# Patient Record
Sex: Female | Born: 1975 | Race: Black or African American | Hispanic: No | Marital: Married | Smoking: Never smoker
Health system: Southern US, Community
[De-identification: ages and names within clinical notes are randomized; demographics above are authoritative.]

## PROBLEM LIST (undated history)

## (undated) ENCOUNTER — Inpatient Hospital Stay (HOSPITAL_COMMUNITY): Payer: Self-pay

## (undated) DIAGNOSIS — Z789 Other specified health status: Secondary | ICD-10-CM

## (undated) DIAGNOSIS — K219 Gastro-esophageal reflux disease without esophagitis: Secondary | ICD-10-CM

## (undated) DIAGNOSIS — F32A Depression, unspecified: Secondary | ICD-10-CM

## (undated) DIAGNOSIS — J9859 Other diseases of mediastinum, not elsewhere classified: Secondary | ICD-10-CM

## (undated) DIAGNOSIS — O139 Gestational [pregnancy-induced] hypertension without significant proteinuria, unspecified trimester: Secondary | ICD-10-CM

## (undated) DIAGNOSIS — J3089 Other allergic rhinitis: Secondary | ICD-10-CM

## (undated) HISTORY — DX: Depression, unspecified: F32.A

## (undated) HISTORY — DX: Gestational (pregnancy-induced) hypertension without significant proteinuria, unspecified trimester: O13.9

## (undated) HISTORY — DX: Other diseases of mediastinum, not elsewhere classified: J98.59

## (undated) HISTORY — DX: Gastro-esophageal reflux disease without esophagitis: K21.9

## (undated) HISTORY — DX: Other allergic rhinitis: J30.89

---

## 2006-09-01 ENCOUNTER — Ambulatory Visit (HOSPITAL_COMMUNITY): Admission: RE | Admit: 2006-09-01 | Discharge: 2006-09-01 | Payer: Self-pay | Admitting: Obstetrics and Gynecology

## 2007-02-01 ENCOUNTER — Ambulatory Visit (HOSPITAL_COMMUNITY): Admission: RE | Admit: 2007-02-01 | Discharge: 2007-02-01 | Payer: Self-pay | Admitting: Obstetrics & Gynecology

## 2007-02-01 ENCOUNTER — Ambulatory Visit: Payer: Self-pay | Admitting: Obstetrics and Gynecology

## 2007-02-08 ENCOUNTER — Inpatient Hospital Stay (HOSPITAL_COMMUNITY): Admission: AD | Admit: 2007-02-08 | Discharge: 2007-02-11 | Payer: Self-pay | Admitting: Obstetrics & Gynecology

## 2007-02-09 ENCOUNTER — Ambulatory Visit: Payer: Self-pay | Admitting: Obstetrics & Gynecology

## 2009-09-17 ENCOUNTER — Emergency Department (HOSPITAL_COMMUNITY): Admission: EM | Admit: 2009-09-17 | Discharge: 2009-09-17 | Payer: Self-pay | Admitting: Emergency Medicine

## 2010-01-10 ENCOUNTER — Ambulatory Visit: Payer: Self-pay | Admitting: Family Medicine

## 2010-01-23 ENCOUNTER — Encounter (INDEPENDENT_AMBULATORY_CARE_PROVIDER_SITE_OTHER): Payer: Self-pay | Admitting: Family Medicine

## 2010-01-23 ENCOUNTER — Ambulatory Visit: Payer: Self-pay | Admitting: Internal Medicine

## 2010-01-23 LAB — CONVERTED CEMR LAB
ALT: 11 units/L (ref 0–35)
AST: 12 units/L (ref 0–37)
Albumin: 4.2 g/dL (ref 3.5–5.2)
Alkaline Phosphatase: 49 units/L (ref 39–117)
BUN: 9 mg/dL (ref 6–23)
Basophils Absolute: 0 10*3/uL (ref 0.0–0.1)
Basophils Relative: 0 % (ref 0–1)
CO2: 25 meq/L (ref 19–32)
Calcium: 9.4 mg/dL (ref 8.4–10.5)
Chloride: 106 meq/L (ref 96–112)
Cholesterol: 133 mg/dL (ref 0–200)
Creatinine, Ser: 0.65 mg/dL (ref 0.40–1.20)
Eosinophils Absolute: 0.1 10*3/uL (ref 0.0–0.7)
Eosinophils Relative: 1 % (ref 0–5)
Glucose, Bld: 92 mg/dL (ref 70–99)
HCT: 38.6 % (ref 36.0–46.0)
HDL: 44 mg/dL (ref >39)
Hemoglobin: 12.4 g/dL (ref 12.0–15.0)
LDL Cholesterol: 83 mg/dL (ref 0–99)
Lymphocytes Relative: 47 % — ABNORMAL HIGH (ref 12–46)
Lymphs Abs: 2.1 10*3/uL (ref 0.7–4.0)
MCHC: 32.1 g/dL (ref 30.0–36.0)
MCV: 91.7 fL (ref 78.0–100.0)
Monocytes Absolute: 0.3 10*3/uL (ref 0.1–1.0)
Monocytes Relative: 7 % (ref 3–12)
Neutro Abs: 2 10*3/uL (ref 1.7–7.7)
Neutrophils Relative %: 45 % (ref 43–77)
Platelets: 229 10*3/uL (ref 150–400)
Potassium: 4.1 meq/L (ref 3.5–5.3)
RBC: 4.21 M/uL (ref 3.87–5.11)
RDW: 13.1 % (ref 11.5–15.5)
Sodium: 139 meq/L (ref 135–145)
Total Bilirubin: 1.3 mg/dL — ABNORMAL HIGH (ref 0.3–1.2)
Total CHOL/HDL Ratio: 3
Total Protein: 7.1 g/dL (ref 6.0–8.3)
Triglycerides: 32 mg/dL (ref <150)
VLDL: 6 mg/dL (ref 0–40)
Vit D, 25-Hydroxy: 27 ng/mL — ABNORMAL LOW (ref 30–89)
WBC: 4.5 10*3/uL (ref 4.0–10.5)

## 2010-06-17 ENCOUNTER — Ambulatory Visit (HOSPITAL_COMMUNITY)
Admission: RE | Admit: 2010-06-17 | Discharge: 2010-06-17 | Payer: Self-pay | Source: Home / Self Care | Admitting: Obstetrics & Gynecology

## 2010-11-05 ENCOUNTER — Ambulatory Visit: Admit: 2010-11-05 | Payer: Self-pay | Admitting: Obstetrics & Gynecology

## 2010-11-08 ENCOUNTER — Inpatient Hospital Stay (HOSPITAL_COMMUNITY)
Admission: AD | Admit: 2010-11-08 | Discharge: 2010-11-10 | Payer: Self-pay | Source: Home / Self Care | Attending: Obstetrics & Gynecology | Admitting: Obstetrics & Gynecology

## 2010-11-11 LAB — CBC
HCT: 37.4 % (ref 36.0–46.0)
HCT: 30.2 % — ABNORMAL LOW (ref 36.0–46.0)
Hemoglobin: 12.5 g/dL (ref 12.0–15.0)
Hemoglobin: 10.3 g/dL — ABNORMAL LOW (ref 12.0–15.0)
MCH: 29.6 pg (ref 26.0–34.0)
MCH: 30.1 pg (ref 26.0–34.0)
MCHC: 33.4 g/dL (ref 30.0–36.0)
MCHC: 34.1 g/dL (ref 30.0–36.0)
MCV: 88.6 fL (ref 78.0–100.0)
MCV: 88.3 fL (ref 78.0–100.0)
Platelets: 230 10*3/uL (ref 150–400)
Platelets: 175 10*3/uL (ref 150–400)
RBC: 4.22 MIL/uL (ref 3.87–5.11)
RBC: 3.42 MIL/uL — ABNORMAL LOW (ref 3.87–5.11)
RDW: 13 % (ref 11.5–15.5)
RDW: 13 % (ref 11.5–15.5)
WBC: 12.6 10*3/uL — ABNORMAL HIGH (ref 4.0–10.5)
WBC: 10.1 10*3/uL (ref 4.0–10.5)

## 2010-11-11 LAB — RPR: RPR Ser Ql: NONREACTIVE

## 2010-11-13 LAB — RH IMMUNE GLOB WKUP(>/=20WKS)(NOT WOMEN'S HOSP)
Fetal Screen: NEGATIVE
Unit division: 0

## 2011-01-29 LAB — POCT I-STAT, CHEM 8
BUN: 12 mg/dL (ref 6–23)
Calcium, Ion: 1.27 mmol/L (ref 1.12–1.32)
Chloride: 104 mEq/L (ref 96–112)
Creatinine, Ser: 0.7 mg/dL (ref 0.4–1.2)
Glucose, Bld: 87 mg/dL (ref 70–99)
HCT: 38 % (ref 36.0–46.0)
Hemoglobin: 12.9 g/dL (ref 12.0–15.0)
Potassium: 3.7 mEq/L (ref 3.5–5.1)
Sodium: 141 mEq/L (ref 135–145)
TCO2: 24 mmol/L (ref 0–100)

## 2015-07-12 ENCOUNTER — Inpatient Hospital Stay (HOSPITAL_COMMUNITY): Payer: Medicaid Other

## 2015-07-12 ENCOUNTER — Encounter (HOSPITAL_COMMUNITY): Payer: Self-pay | Admitting: *Deleted

## 2015-07-12 ENCOUNTER — Inpatient Hospital Stay (HOSPITAL_COMMUNITY)
Admission: AD | Admit: 2015-07-12 | Discharge: 2015-07-12 | Disposition: A | Discharge status: Home / Self Care | Payer: Medicaid Other | Source: Ambulatory Visit | Attending: Family Medicine | Admitting: Family Medicine

## 2015-07-12 DIAGNOSIS — Z3A01 Less than 8 weeks gestation of pregnancy: Secondary | ICD-10-CM | POA: Diagnosis not present

## 2015-07-12 DIAGNOSIS — O26899 Other specified pregnancy related conditions, unspecified trimester: Secondary | ICD-10-CM

## 2015-07-12 DIAGNOSIS — O200 Threatened abortion: Secondary | ICD-10-CM | POA: Diagnosis not present

## 2015-07-12 DIAGNOSIS — R109 Unspecified abdominal pain: Secondary | ICD-10-CM | POA: Diagnosis not present

## 2015-07-12 DIAGNOSIS — O9989 Other specified diseases and conditions complicating pregnancy, childbirth and the puerperium: Secondary | ICD-10-CM

## 2015-07-12 DIAGNOSIS — O3680X0 Pregnancy with inconclusive fetal viability, not applicable or unspecified: Secondary | ICD-10-CM

## 2015-07-12 DIAGNOSIS — O209 Hemorrhage in early pregnancy, unspecified: Secondary | ICD-10-CM

## 2015-07-12 HISTORY — DX: Other specified health status: Z78.9

## 2015-07-12 LAB — CBC
HCT: 38.8 % (ref 36.0–46.0)
Hemoglobin: 12.9 g/dL (ref 12.0–15.0)
MCH: 29.8 pg (ref 26.0–34.0)
MCHC: 33.2 g/dL (ref 30.0–36.0)
MCV: 89.6 fL (ref 78.0–100.0)
Platelets: 251 10*3/uL (ref 150–400)
RBC: 4.33 MIL/uL (ref 3.87–5.11)
RDW: 13.4 % (ref 11.5–15.5)
WBC: 6.9 10*3/uL (ref 4.0–10.5)

## 2015-07-12 LAB — POCT PREGNANCY, URINE: Preg Test, Ur: POSITIVE — AB

## 2015-07-12 LAB — URINALYSIS, ROUTINE W REFLEX MICROSCOPIC
Bilirubin Urine: NEGATIVE
Glucose, UA: NEGATIVE mg/dL
Ketones, ur: NEGATIVE mg/dL
Nitrite: NEGATIVE
Protein, ur: NEGATIVE mg/dL
Specific Gravity, Urine: 1.01 (ref 1.005–1.030)
Urobilinogen, UA: 0.2 mg/dL (ref 0.0–1.0)
pH: 6 (ref 5.0–8.0)

## 2015-07-12 LAB — ABO/RH: ABO/RH(D): O NEG

## 2015-07-12 LAB — URINE MICROSCOPIC-ADD ON

## 2015-07-12 LAB — HCG, QUANTITATIVE, PREGNANCY: hCG, Beta Chain, Quant, S: 398 m[IU]/mL — ABNORMAL HIGH (ref <5)

## 2015-07-12 MED ORDER — RHO D IMMUNE GLOBULIN 1500 UNIT/2ML IJ SOSY
300.0000 ug | PREFILLED_SYRINGE | Freq: Once | INTRAMUSCULAR | Status: AC
  Administered 2015-07-12: 300 ug via INTRAMUSCULAR
  Filled 2015-07-12: qty 2

## 2015-07-12 MED ORDER — IBUPROFEN 600 MG PO TABS: 600.0000 mg | ORAL_TABLET | Freq: Four times a day (QID) | ORAL | Status: DC | PRN

## 2015-07-12 NOTE — MAU Note (Signed)
Rhophylac information sheet given to pt.

## 2015-07-12 NOTE — MAU Provider Note (Signed)
History     CSN: [id number]  Arrival date and time: 07/12/15 2836   First Provider Initiated Contact with Patient 07/12/15 1043      Chief Complaint  Patient presents with  . Vaginal Bleeding   HPI   Casey Mayo is a 39 y.o. female [id number] at [redacted]w[redacted]d presenting to MAU with vaginal bleeding. The bleeding started yesterday; no recent intercourse.  The bleeding is described as light currently, however yesterday it was like a menstrual cycle.   The bleeding is heavy enough that she has a pad on currently. She is having lower abdominal cramping she rates her pain 3/10. She has not taken any pain medication today.   She was seen at the [facility] department on 8/24; reviewed records that the patient brought with her. Patient said she had a speculum and cultures were obtained.     OB History    Gravida Para Term Preterm AB TAB SAB Ectopic Multiple Living   5 3   1     3       Past Medical History  Diagnosis Date  . Medical history non-contributory     History reviewed. No pertinent past surgical history.  History reviewed. No pertinent family history.  Social History  Substance Use Topics  . Smoking status: Never Smoker   . Smokeless tobacco: None  . Alcohol Use: No    Allergies: No Known Allergies  Prescriptions prior to admission  Medication Sig Dispense Refill Last Dose  . acetaminophen (TYLENOL) 325 MG tablet Take 650 mg by mouth every 6 (six) hours as needed for mild pain.   07/11/2015 at Unknown time  . Multiple Vitamin (MULTIVITAMIN WITH MINERALS) TABS tablet Take 1 tablet by mouth daily.   Past Week at Unknown time   Results for orders placed or performed during the hospital encounter of 07/12/15 (from the past 48 hour(s))  Urinalysis, Routine w reflex microscopic (not at [facility])     Status: Abnormal   Collection Time: 07/12/15  9:00 AM  Result Value Ref Range   Color, Urine YELLOW YELLOW   APPearance CLEAR CLEAR   Specific Gravity, Urine 1.010  1.005 - 1.030   pH 6.0 5.0 - 8.0   Glucose, UA NEGATIVE NEGATIVE mg/dL   Hgb urine dipstick LARGE (A) NEGATIVE   Bilirubin Urine NEGATIVE NEGATIVE   Ketones, ur NEGATIVE NEGATIVE mg/dL   Protein, ur NEGATIVE NEGATIVE mg/dL   Urobilinogen, UA 0.2 0.0 - 1.0 mg/dL   Nitrite NEGATIVE NEGATIVE   Leukocytes, UA TRACE (A) NEGATIVE  Urine microscopic-add on     Status: None   Collection Time: 07/12/15  9:00 AM  Result Value Ref Range   Squamous Epithelial / LPF RARE RARE   WBC, UA 0-2 <3 WBC/hpf   RBC / HPF 21-50 <3 RBC/hpf  Pregnancy, urine POC     Status: Abnormal   Collection Time: 07/12/15  9:07 AM  Result Value Ref Range   Preg Test, Ur POSITIVE (A) NEGATIVE    Comment:        THE SENSITIVITY OF THIS METHODOLOGY IS >24 mIU/mL   CBC     Status: None   Collection Time: 07/12/15 11:25 AM  Result Value Ref Range   WBC 6.9 4.0 - 10.5 K/uL   RBC 4.33 3.87 - 5.11 MIL/uL   Hemoglobin 12.9 12.0 - 15.0 g/dL   HCT 38.8 36.0 - 46.0 %   MCV 89.6 78.0 - 100.0 fL   MCH 29.8 26.0 - 34.0 pg  MCHC 33.2 30.0 - 36.0 g/dL   RDW 13.4 11.5 - 15.5 %   Platelets 251 150 - 400 K/uL  ABO/Rh     Status: None   Collection Time: 07/12/15 11:25 AM  Result Value Ref Range   ABO/RH(D) O NEG   hCG, quantitative, pregnancy     Status: Abnormal   Collection Time: 07/12/15 11:25 AM  Result Value Ref Range   hCG, Beta Chain, Quant, S 398 (H) <5 mIU/mL    Comment:          GEST. AGE      CONC.  (mIU/mL)   <=1 WEEK        5 - 50     2 WEEKS       50 - 500     3 WEEKS       100 - 10,000     4 WEEKS     1,000 - 30,000     5 WEEKS     3,500 - 115,000   6-8 WEEKS     12,000 - 270,000    12 WEEKS     15,000 - 220,000        FEMALE AND NON-PREGNANT FEMALE:     LESS THAN 5 mIU/mL Performed at [facility]   Rh IG workup (includes ABO/Rh)     Status: None (Preliminary result)   Collection Time: 07/12/15 11:25 AM  Result Value Ref Range   Gestational Age(Wks) 7    ABO/RH(D) O NEG    Unit Number  [id number]    Blood Component Type RHIG    Unit division 00    Status of Unit ISSUED    Transfusion Status OK TO TRANSFUSE      Review of Systems  Constitutional: Negative for fever.  Gastrointestinal: Positive for abdominal pain. Negative for nausea and vomiting.  Genitourinary: Negative for dysuria.   Physical Exam   Blood pressure 124/62, pulse 66, resp. rate 18, height 5\' 9"  (1.753 m), weight 93.441 kg (206 lb), last menstrual period 05/20/2015, SpO2 100 %.  Physical Exam  Constitutional: She is oriented to person, place, and time. She appears well-developed and well-nourished. No distress.  HENT:  Head: Normocephalic.  Eyes: Pupils are equal, round, and reactive to light.  Neck: Neck supple.  Respiratory: Effort normal.  GI: Soft. She exhibits no distension. There is no tenderness. There is no rebound and no guarding.  Genitourinary:  Speculum exam: Vagina - Moderate amount of thick, bright red blood pooling in the vagina.  Cervix -+ active bleeding  Bimanual exam: Cervix 1cm, thick, anterior  Uterus non tender, normal size Adnexa non tender, no masses bilaterally Chaperone present for exam.  Musculoskeletal: Normal range of motion.  Neurological: She is alert and oriented to person, place, and time.  Skin: Skin is warm. She is not diaphoretic.  Psychiatric: Her behavior is normal.    MAU Course  Procedures  None  MDM  O negative blood type: rhogam given IM   Assessment and Plan   A:  1. Abdominal pain affecting pregnancy   2. Vaginal bleeding in pregnancy, first trimester   3. Threatened miscarriage   4. Pregnancy of unknown anatomic location      P:   Discharge home in stable condition.  Return to MAU in 48 hours for repeat beta hcg level Bleeding precautions Ectopic pregnancy has not been excluded. RX: ibuprofen Return to MAU if symptoms worsen Pelvic rest  Support given    Artist Pais  Rasch, NP 07/12/2015 10:54 AM

## 2015-07-12 NOTE — MAU Note (Addendum)
+  UPT at [facility], [facility] April 2017, LMP  May 20, 2015, had spotting yesterday, today @  5am had big gush of red bleeding, now spotting only, no pad, just spotting when UA/UPT spec obtained

## 2015-07-12 NOTE — Discharge Instructions (Signed)

## 2015-07-13 LAB — RH IG WORKUP (INCLUDES ABO/RH)
ABO/RH(D): O NEG
Antibody Screen: NEGATIVE
Gestational Age(Wks): 7
Unit division: 0

## 2015-07-13 LAB — HIV ANTIBODY (ROUTINE TESTING W REFLEX): HIV Screen 4th Generation wRfx: NONREACTIVE

## 2015-07-14 ENCOUNTER — Inpatient Hospital Stay (HOSPITAL_COMMUNITY)
Admission: AD | Admit: 2015-07-14 | Discharge: 2015-07-14 | Disposition: A | Discharge status: Home / Self Care | Payer: Medicaid Other | Source: Ambulatory Visit | Attending: Obstetrics & Gynecology | Admitting: Obstetrics & Gynecology

## 2015-07-14 DIAGNOSIS — O26891 Other specified pregnancy related conditions, first trimester: Secondary | ICD-10-CM | POA: Diagnosis present

## 2015-07-14 DIAGNOSIS — Z3A01 Less than 8 weeks gestation of pregnancy: Secondary | ICD-10-CM | POA: Insufficient documentation

## 2015-07-14 DIAGNOSIS — O039 Complete or unspecified spontaneous abortion without complication: Secondary | ICD-10-CM | POA: Insufficient documentation

## 2015-07-14 LAB — HCG, QUANTITATIVE, PREGNANCY: hCG, Beta Chain, Quant, S: 49 m[IU]/mL — ABNORMAL HIGH (ref <5)

## 2015-07-14 NOTE — MAU Provider Note (Signed)
History   [id number]   Chief Complaint  Patient presents with  . Labs Only    HPI Casey Mayo is a 39 y.o. female [phone] here for follow-up BHCG.  Upon review of the records patient was first seen on 9/15 for abdominal pain.   BHCG on that day was 398.  Ultrasound showed no evidence of pregnancy. Pt here today, reports no abdominal pain and no increase in vaginal bleeding.   All other systems negative.   Patient's last menstrual period was 05/20/2015.  OB History  Gravida Para Term Preterm AB SAB TAB Ectopic Multiple Living  6 3 3  2     3     # Outcome Date GA Lbr Len/2nd Weight Sex Delivery Anes PTL Lv  6 Current           5 AB           4 AB           3 Term           2 Term           1 Term               Past Medical History  Diagnosis Date  . Medical history non-contributory     No family history on file.  Social History   Social History  . Marital Status: Married    Spouse Name: N/A  . Number of Children: N/A  . Years of Education: N/A   Social History Main Topics  . Smoking status: Never Smoker   . Smokeless tobacco: Not on file  . Alcohol Use: No  . Drug Use: No  . Sexual Activity: Yes    Birth Control/ Protection: None     Comment: last intercourse Jul 09 2015   Other Topics Concern  . Not on file   Social History Narrative  . No narrative on file    No Known Allergies  No current facility-administered medications on file prior to encounter.   Current Outpatient Prescriptions on File Prior to Encounter  Medication Sig Dispense Refill  . acetaminophen (TYLENOL) 325 MG tablet Take 650 mg by mouth every 6 (six) hours as needed for mild pain.    Marland Kitchen ibuprofen (ADVIL,MOTRIN) 600 MG tablet Take 1 tablet (600 mg total) by mouth every 6 (six) hours as needed. 30 tablet 0  . Multiple Vitamin (MULTIVITAMIN WITH MINERALS) TABS tablet Take 1 tablet by mouth daily.       Physical Exam   Filed Vitals:   07/14/15 1631  BP: 125/68  Pulse: 64  Temp:  98.2 F (36.8 C)  TempSrc: Oral  Resp: 16    Physical Exam  Constitutional: She is oriented to person, place, and time. She appears well-developed and well-nourished. No distress.  HENT:  Head: Normocephalic and atraumatic.  Eyes: Conjunctivae are normal. Right eye exhibits no discharge. Left eye exhibits no discharge.  Cardiovascular: Normal rate.   Respiratory: Effort normal. No respiratory distress.  Neurological: She is alert and oriented to person, place, and time.  Skin: She is not diaphoretic.  Psychiatric: She has a normal mood and affect. Her behavior is normal. Judgment and thought content normal.    MAU Course  Procedures Results for orders placed or performed during the hospital encounter of 07/14/15 (from the past 24 hour(s))  hCG, quantitative, pregnancy     Status: Abnormal   Collection Time: 07/14/15  4:40 PM  Result Value Ref Range   hCG, Beta  Chain, Quant, S 49 (H) <5 mIU/mL    MDM BHCG has dropped from 398 > 49 in 2 days.  O negative - Patient was given rhophylac on 9/15 No abdominal pain today & decreased vaginal bleeding.  Patient to f/u in the clinic  Assessment and Plan  39 y.o. [id number] at [redacted]w[redacted]d wks Pregnancy Spontaneous miscarriage - Plan: Discharge patient  Schedule f/u in [facility] Discussed reasons to return to MAU Comfort pack given   Jorje Guild, NP 07/14/2015 5:51 PM

## 2015-07-14 NOTE — MAU Note (Signed)
Patient presents for follow up blood work today [facility]), still having vaginal bleeding like before, denies pain states only mild cramping.

## 2015-07-14 NOTE — Discharge Instructions (Signed)
Miscarriage A miscarriage is the sudden loss of an unborn baby (fetus) before the 20th week of pregnancy. Most miscarriages happen in the first 3 months of pregnancy. Sometimes, it happens before a woman even knows she is pregnant. A miscarriage is also called a "spontaneous miscarriage" or "early pregnancy loss." Having a miscarriage can be an emotional experience. Talk with your caregiver about any questions you may have about miscarrying, the grieving process, and your future pregnancy plans. CAUSES   Problems with the fetal chromosomes that make it impossible for the baby to develop normally. Problems with the baby's genes or chromosomes are most often the result of errors that occur, by chance, as the embryo divides and grows. The problems are not inherited from the parents.  Infection of the cervix or uterus.   Hormone problems.   Problems with the cervix, such as having an incompetent cervix. This is when the tissue in the cervix is not strong enough to hold the pregnancy.   Problems with the uterus, such as an abnormally shaped uterus, uterine fibroids, or congenital abnormalities.   Certain medical conditions.   Smoking, drinking alcohol, or taking illegal drugs.   Trauma.  Often, the cause of a miscarriage is unknown.  SYMPTOMS   Vaginal bleeding or spotting, with or without cramps or pain.  Pain or cramping in the abdomen or lower back.  Passing fluid, tissue, or blood clots from the vagina. DIAGNOSIS  Your caregiver will perform a physical exam. You may also have an ultrasound to confirm the miscarriage. Blood or urine tests may also be ordered. TREATMENT   Sometimes, treatment is not necessary if you naturally pass all the fetal tissue that was in the uterus. If some of the fetus or placenta remains in the body (incomplete miscarriage), tissue left behind may become infected and must be removed. Usually, a dilation and curettage (D and C) procedure is performed.  During a D and C procedure, the cervix is widened (dilated) and any remaining fetal or placental tissue is gently removed from the uterus.  Antibiotic medicines are prescribed if there is an infection. Other medicines may be given to reduce the size of the uterus (contract) if there is a lot of bleeding.  If you have Rh negative blood and your baby was Rh positive, you will need a Rh immunoglobulin shot. This shot will protect any future baby from having Rh blood problems in future pregnancies. HOME CARE INSTRUCTIONS   Your caregiver may order bed rest or may allow you to continue light activity. Resume activity as directed by your caregiver.  Have someone help with home and family responsibilities during this time.   Keep track of the number of sanitary pads you use each day and how soaked (saturated) they are. Write down this information.   Do not use tampons. Do not douche or have sexual intercourse until approved by your caregiver.   Only take over-the-counter or prescription medicines for pain or discomfort as directed by your caregiver.   Do not take aspirin. Aspirin can cause bleeding.   Keep all follow-up appointments with your caregiver.   If you or your partner have problems with grieving, talk to your caregiver or seek counseling to help cope with the pregnancy loss. Allow enough time to grieve before trying to get pregnant again.  SEEK IMMEDIATE MEDICAL CARE IF:   You have severe cramps or pain in your back or abdomen.  You have a fever.  You pass large blood clots (walnut-sized   or larger) ortissue from your vagina. Save any tissue for your caregiver to inspect.   Your bleeding increases.   You have a thick, bad-smelling vaginal discharge.  You become lightheaded, weak, or you faint.   You have chills.  MAKE SURE YOU:  Understand these instructions.  Will watch your condition.  Will get help right away if you are not doing well or get  worse. Document Released: 04/08/2001 Document Revised: 02/07/2013 Document Reviewed: 12/02/2011 Exit[employer] Patient Information 2015 [employer]. This information is not intended to replace advice given to you by your health care provider. Make sure you discuss any questions you have with your health care provider.  

## 2015-07-27 ENCOUNTER — Other Ambulatory Visit: Payer: Self-pay

## 2015-10-28 DIAGNOSIS — O139 Gestational [pregnancy-induced] hypertension without significant proteinuria, unspecified trimester: Secondary | ICD-10-CM | POA: Insufficient documentation

## 2015-10-28 HISTORY — DX: Gestational (pregnancy-induced) hypertension without significant proteinuria, unspecified trimester: O13.9

## 2015-10-28 NOTE — L&D Delivery Note (Signed)
Patient is 40 y.o. [id number] [redacted]w[redacted]d admitted IOL for gHTN  Delivery Note At 8:12 AM a viable female was delivered via Vaginal, Spontaneous Delivery (Presentation: Right Occiput Anterior).  APGAR: 9, 9; weight  .   Placenta status: Intact, Spontaneous.  Cord:  with the following complications: none .  Cord pH: n/a  Anesthesia: None  Episiotomy: None Lacerations: 1st degree Suture Repair: 3.0 Est. Blood Loss (mL):    Mom to postpartum.  Baby to Couplet care / Skin to Skin.  Mercy Riding 04/22/2016, 8:23 AM

## 2015-10-30 ENCOUNTER — Other Ambulatory Visit (HOSPITAL_COMMUNITY): Payer: Self-pay | Admitting: Urology

## 2015-10-30 DIAGNOSIS — O09522 Supervision of elderly multigravida, second trimester: Secondary | ICD-10-CM

## 2015-10-30 DIAGNOSIS — O26842 Uterine size-date discrepancy, second trimester: Secondary | ICD-10-CM

## 2015-10-30 LAB — OB RESULTS CONSOLE RPR: RPR: NONREACTIVE

## 2015-10-30 LAB — OB RESULTS CONSOLE GC/CHLAMYDIA
Chlamydia: NEGATIVE
GC PROBE AMP, GENITAL: NEGATIVE

## 2015-10-30 LAB — OB RESULTS CONSOLE RUBELLA ANTIBODY, IGM: RUBELLA: IMMUNE

## 2015-10-30 LAB — OB RESULTS CONSOLE HEPATITIS B SURFACE ANTIGEN: HEP B S AG: NEGATIVE

## 2015-11-07 ENCOUNTER — Encounter (HOSPITAL_COMMUNITY): Payer: Self-pay | Admitting: Urology

## 2015-11-14 ENCOUNTER — Ambulatory Visit (HOSPITAL_COMMUNITY)
Admission: RE | Admit: 2015-11-14 | Discharge: 2015-11-14 | Disposition: A | Discharge status: Home / Self Care | Payer: Medicaid Other | Source: Ambulatory Visit | Attending: Physician Assistant | Admitting: Physician Assistant

## 2015-11-14 ENCOUNTER — Other Ambulatory Visit (HOSPITAL_COMMUNITY): Payer: Self-pay | Admitting: Urology

## 2015-11-14 ENCOUNTER — Encounter (HOSPITAL_COMMUNITY): Payer: Self-pay

## 2015-11-14 DIAGNOSIS — B1910 Unspecified viral hepatitis B without hepatic coma: Secondary | ICD-10-CM | POA: Insufficient documentation

## 2015-11-14 DIAGNOSIS — Z3A17 17 weeks gestation of pregnancy: Secondary | ICD-10-CM | POA: Diagnosis not present

## 2015-11-14 DIAGNOSIS — O09522 Supervision of elderly multigravida, second trimester: Secondary | ICD-10-CM

## 2015-11-14 DIAGNOSIS — O98412 Viral hepatitis complicating pregnancy, second trimester: Secondary | ICD-10-CM | POA: Insufficient documentation

## 2015-11-14 DIAGNOSIS — Z315 Encounter for genetic counseling: Secondary | ICD-10-CM | POA: Diagnosis not present

## 2015-11-14 DIAGNOSIS — O26842 Uterine size-date discrepancy, second trimester: Secondary | ICD-10-CM

## 2015-11-14 NOTE — Progress Notes (Signed)
Appointment Date: 11/14/2015 DOB: 1976-09-28 Referring Provider: Anders Grant, NP Attending: Renella Cunas, MD   Casey Mayo was seen for genetic counseling because of a maternal age of 40 y.o..  She declined use of a medical interpreter after it was offered when she struggled with completing paperwork in [place], insisting that she understood and spoke [place] well and that she had been in the [place] for over 10 years.  In summary:  Discussed AMA and increased risk for fetal aneuploidy  Discussed options for diagnostic testing or screening  Declined amniocentesis  Declined NIPS  Declined Quad screen  Discussed paternal age of 76 and associated risks  She was counseled regarding maternal age and the association with risk for chromosome conditions due to nondisjunction.   We reviewed chromosomes, nondisjunction, and the associated 1 in 24 risk for fetal aneuploidy related to a maternal age of 40 y.o. at [redacted]w[redacted]d gestation.  She was counseled that the risk for aneuploidy decreases as gestational age increases, accounting for those pregnancies which spontaneously abort.  We specifically discussed Down syndrome (trisomy 49), trisomies 7 and 1, and sex chromosome aneuploidies (47,XXX and 47,XXY) including the common features and prognoses of each.   We reviewed available screening options including Quad screen, noninvasive prenatal screening (NIPS)/cell free DNA (cfDNA) testing, and detailed ultrasound.  She was counseled that screening tests are used to modify a patient's a priori risk for aneuploidy, typically based on age. This estimate provides a pregnancy specific risk assessment. We reviewed the benefits and limitations of each option. Specifically, we discussed the conditions for which each test screens, the detection rates, and false positive rates of each. She was also counseled regarding diagnostic testing via amniocentesis. We reviewed the approximate 1 in [id number] risk for complications  for amniocentesis, including spontaneous pregnancy loss.   The patient expressed interest in having a detailed ultrasound.  A complete ultrasound was performed today. The ultrasound report will be sent under separate cover. There were no visualized fetal anomalies or markers suggestive of aneuploidy. Diagnostic testing was declined today.  She understands that screening tests, including ultrasound, cannot rule out all birth defects or genetic syndromes. The patient was advised of this limitation and states she still does not want additional testing at this time.   Casey Mayo was provided with written information regarding sickle cell anemia (SCA) including the carrier frequency and incidence in the African population, the availability of carrier testing and prenatal diagnosis if indicated.  In addition, we discussed that hemoglobinopathies are routinely screened for as part of the [place] newborn screening panel.  She declined hemoglobin electrophoresis today.  Both family histories were reviewed and found to be noncontributory for birth defects, intellectual disability, and known genetic conditions.  Casey Mayo reported that her husband is 34 years old.  We discussed that advanced paternal age is defined as paternal age greater than or equal to age 62.  Recent large-scale sequencing studies have shown that approximately 80% of de novo point mutations are of paternal origin.  Many studies have demonstrated a strong correlation between increased paternal age and de novo point mutations.  It is estimated that the overall chance for a de novo mutation is approximately 0.5%.  We also discussed the wide range of conditions which can be caused by new dominant gene mutations (achondroplasia, neurofibromatosis, Marfan syndrome etc.).  She was counseled that genetic testing for each individual single gene condition is not warranted or available unless ultrasound or concerns lend suspicion to a specific condition.  She  was also counseled that some literature suggests that APA is also associated with an increase in risk for fetal aneuploidy.  While other literature does not support this, we discussed that a specific risk for aneuploidy other than that based on maternal age cannot be quantified. Lastly, we discussed that newer literature suggests that the risk for autism spectrum disorders (ASD) may be increased in children born to fathers of APA.  We discussed that ASDs are among the most common neurodevelopmental disorders, with approximately 1 in 85 children meeting criteria for ASD.  Approximately 80% of individuals diagnosed are female.  There is strong evidence that genetic factors play a critical role in development of ASD.  While there have been recent advances in identifying specific genetic causes of ASD, there are still many individuals for whom the etiology of the ASD is not known.  They understand that at this time there is no reliable, comprehensive genetic testing available for ASD and a specific paternal age related risk can not be quantified.  Without further information regarding the provided family history, an accurate genetic risk cannot be calculated. Further genetic counseling is warranted if more information is obtained.  Casey Mayo denied exposure to environmental toxins or chemical agents. She denied the use of alcohol, tobacco or street drugs. She denied significant viral illnesses during the course of her pregnancy. Her medical and surgical histories were noncontributory.   I counseled Casey Mayo regarding the above risks and available options.  The approximate face-to-face time with the genetic counselor was 45 minutes.  Cam Hai, MS,  Certified Genetic Counselor

## 2016-01-21 ENCOUNTER — Other Ambulatory Visit (HOSPITAL_COMMUNITY): Payer: Self-pay | Admitting: Nurse Practitioner

## 2016-01-21 DIAGNOSIS — O09523 Supervision of elderly multigravida, third trimester: Secondary | ICD-10-CM

## 2016-01-31 ENCOUNTER — Ambulatory Visit (HOSPITAL_COMMUNITY)
Admission: RE | Admit: 2016-01-31 | Discharge: 2016-01-31 | Disposition: A | Discharge status: Home / Self Care | Payer: Medicaid Other | Source: Ambulatory Visit | Attending: Nurse Practitioner | Admitting: Nurse Practitioner

## 2016-01-31 ENCOUNTER — Other Ambulatory Visit (HOSPITAL_COMMUNITY): Payer: Self-pay | Admitting: Nurse Practitioner

## 2016-01-31 DIAGNOSIS — Z3A29 29 weeks gestation of pregnancy: Secondary | ICD-10-CM | POA: Insufficient documentation

## 2016-01-31 DIAGNOSIS — B1910 Unspecified viral hepatitis B without hepatic coma: Secondary | ICD-10-CM | POA: Insufficient documentation

## 2016-01-31 DIAGNOSIS — O98413 Viral hepatitis complicating pregnancy, third trimester: Secondary | ICD-10-CM | POA: Insufficient documentation

## 2016-01-31 DIAGNOSIS — O09523 Supervision of elderly multigravida, third trimester: Secondary | ICD-10-CM | POA: Insufficient documentation

## 2016-03-28 LAB — OB RESULTS CONSOLE GBS: STREP GROUP B AG: NEGATIVE

## 2016-04-21 ENCOUNTER — Inpatient Hospital Stay (HOSPITAL_COMMUNITY)
Admission: AD | Admit: 2016-04-21 | Discharge: 2016-04-23 | DRG: 775 | Disposition: A | Discharge status: Home / Self Care | Payer: Medicaid Other | Source: Ambulatory Visit | Attending: Family Medicine | Admitting: Family Medicine

## 2016-04-21 ENCOUNTER — Encounter (HOSPITAL_COMMUNITY): Payer: Self-pay | Admitting: *Deleted

## 2016-04-21 DIAGNOSIS — Z3A40 40 weeks gestation of pregnancy: Secondary | ICD-10-CM | POA: Diagnosis present

## 2016-04-21 DIAGNOSIS — O134 Gestational [pregnancy-induced] hypertension without significant proteinuria, complicating childbirth: Principal | ICD-10-CM | POA: Diagnosis present

## 2016-04-21 DIAGNOSIS — O139 Gestational [pregnancy-induced] hypertension without significant proteinuria, unspecified trimester: Secondary | ICD-10-CM | POA: Diagnosis present

## 2016-04-21 DIAGNOSIS — Z79899 Other long term (current) drug therapy: Secondary | ICD-10-CM

## 2016-04-21 DIAGNOSIS — O133 Gestational [pregnancy-induced] hypertension without significant proteinuria, third trimester: Secondary | ICD-10-CM

## 2016-04-21 LAB — COMPREHENSIVE METABOLIC PANEL
ALBUMIN: 3.1 g/dL — AB (ref 3.5–5.0)
ALK PHOS: 164 U/L — AB (ref 38–126)
ALT: 30 U/L (ref 14–54)
AST: 33 U/L (ref 15–41)
Anion gap: 4 — ABNORMAL LOW (ref 5–15)
BILIRUBIN TOTAL: 0.7 mg/dL (ref 0.3–1.2)
BUN: 9 mg/dL (ref 6–20)
CALCIUM: 10.7 mg/dL — AB (ref 8.9–10.3)
CO2: 23 mmol/L (ref 22–32)
CREATININE: 0.64 mg/dL (ref 0.44–1.00)
Chloride: 108 mmol/L (ref 101–111)
GFR calc Af Amer: >60 mL/min (ref >60)
GFR calc non Af Amer: >60 mL/min (ref >60)
GLUCOSE: 95 mg/dL (ref 65–99)
Potassium: 3.8 mmol/L (ref 3.5–5.1)
Sodium: 135 mmol/L (ref 135–145)
TOTAL PROTEIN: 6.5 g/dL (ref 6.5–8.1)

## 2016-04-21 LAB — PROTEIN / CREATININE RATIO, URINE
Creatinine, Urine: 71 mg/dL
Protein Creatinine Ratio: 0.11 mg/mg{Cre} (ref 0.00–0.15)
Total Protein, Urine: 8 mg/dL

## 2016-04-21 LAB — CBC
HEMATOCRIT: 31.8 % — AB (ref 36.0–46.0)
HEMOGLOBIN: 10.6 g/dL — AB (ref 12.0–15.0)
MCH: 28.5 pg (ref 26.0–34.0)
MCHC: 33.3 g/dL (ref 30.0–36.0)
MCV: 85.5 fL (ref 78.0–100.0)
Platelets: 220 10*3/uL (ref 150–400)
RBC: 3.72 MIL/uL — AB (ref 3.87–5.11)
RDW: 13.4 % (ref 11.5–15.5)
WBC: 7 10*3/uL (ref 4.0–10.5)

## 2016-04-21 LAB — URINALYSIS, ROUTINE W REFLEX MICROSCOPIC
Bilirubin Urine: NEGATIVE
Glucose, UA: NEGATIVE mg/dL
HGB URINE DIPSTICK: NEGATIVE
Ketones, ur: NEGATIVE mg/dL
Leukocytes, UA: NEGATIVE
Nitrite: NEGATIVE
Protein, ur: NEGATIVE mg/dL
pH: 7 (ref 5.0–8.0)

## 2016-04-21 MED ORDER — OXYCODONE-ACETAMINOPHEN 5-325 MG PO TABS: 2.0000 | ORAL_TABLET | ORAL | Status: DC | PRN

## 2016-04-21 MED ORDER — SOD CITRATE-CITRIC ACID 500-334 MG/5ML PO SOLN: 30.0000 mL | ORAL | Status: DC | PRN

## 2016-04-21 MED ORDER — TERBUTALINE SULFATE 1 MG/ML IJ SOLN: 0.2500 mg | Freq: Once | INTRAMUSCULAR | Status: DC | PRN

## 2016-04-21 MED ORDER — LACTATED RINGERS IV SOLN: 500.0000 mL | INTRAVENOUS | Status: DC | PRN

## 2016-04-21 MED ORDER — MISOPROSTOL 25 MCG QUARTER TABLET
25.0000 ug | ORAL_TABLET | ORAL | Status: DC | PRN
  Administered 2016-04-22: 25 ug via VAGINAL
  Filled 2016-04-21: qty 0.25

## 2016-04-21 MED ORDER — OXYCODONE-ACETAMINOPHEN 5-325 MG PO TABS: 1.0000 | ORAL_TABLET | ORAL | Status: DC | PRN

## 2016-04-21 MED ORDER — LIDOCAINE HCL (PF) 1 % IJ SOLN
30.0000 mL | INTRAMUSCULAR | Status: DC | PRN
  Administered 2016-04-22: 30 mL via SUBCUTANEOUS
  Filled 2016-04-21: qty 30

## 2016-04-21 MED ORDER — ACETAMINOPHEN 325 MG PO TABS: 650.0000 mg | ORAL_TABLET | ORAL | Status: DC | PRN

## 2016-04-21 MED ORDER — OXYTOCIN 40 UNITS IN LACTATED RINGERS INFUSION - SIMPLE MED
2.5000 [IU]/h | INTRAVENOUS | Status: DC
Start: 2016-04-21 — End: 2016-04-22
  Filled 2016-04-21: qty 1000

## 2016-04-21 MED ORDER — OXYTOCIN BOLUS FROM INFUSION
500.0000 mL | INTRAVENOUS | Status: DC
  Administered 2016-04-22: 500 mL via INTRAVENOUS

## 2016-04-21 MED ORDER — ONDANSETRON HCL 4 MG/2ML IJ SOLN: 4.0000 mg | Freq: Four times a day (QID) | INTRAMUSCULAR | Status: DC | PRN

## 2016-04-21 MED ORDER — LACTATED RINGERS IV SOLN
INTRAVENOUS | Status: DC
  Administered 2016-04-21: 125 mL/h via INTRAVENOUS
  Administered 2016-04-22: 09:00:00 via INTRAVENOUS

## 2016-04-21 NOTE — MAU Note (Addendum)
PT  SAYS  SHE WENT   TO HD    AT 1030AM   -  AND BP  WAS ELEVATED    -  TOLD   TO COME  HERE-   NOONE  TO WATCH   CHILDREN -   SO  CAME    NOW.  DENIES H/A,     BLURRED  VISION,       NO  EPIGASTRIC  PAIN  NOW-   BUT  SOMETIMES  HAS   PAIN  UNDER HER LEFT  BREAST.        VE LAST Thursday -   CLOSED -  VERTEX.      IS [place]  FOR AN iNDUCTION ON WED  AT 2345.      DENIES HSV AND MRSA.     THINKS   GBS- NEG

## 2016-04-21 NOTE — MAU Provider Note (Signed)
History     CSN: [id number]  Arrival date and time: 04/21/16 [id number]   First Provider Initiated Contact with Patient 04/21/16 2007      Chief Complaint  Patient presents with  . Hypertension   HPI Casey Mayo is a 40 y.o. [id number] at [redacted]w[redacted]d who presents for BP evaluation. [facility] this [facility]; had bp 140s/80s & sent her for evaluation. Pt unable to come during the day b/c she didn't have child care. Denies hx of hypertension. Denies headache, vision changes, epigastric pain, CP, or SOB. Positive fetal movement.   OB History    Gravida Para Term Preterm AB TAB SAB Ectopic Multiple Living   6 3 3  2     3       Past Medical History  Diagnosis Date  . Medical history non-contributory     History reviewed. No pertinent past surgical history.  History reviewed. No pertinent family history.  Social History  Substance Use Topics  . Smoking status: Never Smoker   . Smokeless tobacco: None  . Alcohol Use: No    Allergies: No Known Allergies  Prescriptions prior to admission  Medication Sig Dispense Refill Last Dose  . acetaminophen (TYLENOL) 325 MG tablet Take 650 mg by mouth every 6 (six) hours as needed for mild pain.   07/11/2015 at Unknown time  . glycopyrrolate (ROBINUL) 2 MG tablet Take 2 mg by mouth 3 (three) times daily.  3   . ibuprofen (ADVIL,MOTRIN) 600 MG tablet Take 1 tablet (600 mg total) by mouth every 6 (six) hours as needed. 30 tablet 0   . Multiple Vitamin (MULTIVITAMIN WITH MINERALS) TABS tablet Take 1 tablet by mouth daily.   Past Week at Unknown time    Review of Systems  Constitutional: Negative.   Eyes: Negative for blurred vision.  Respiratory: Negative.   Cardiovascular: Negative.   Gastrointestinal: Negative.   Neurological: Negative for dizziness and headaches.   Physical Exam   Blood pressure 137/79, pulse 83, temperature 98.3 F (36.8 C), temperature source Oral, resp. rate 18, height 5' 9.5" (1.765 m), weight 216 lb 12 oz  (98.317 kg), last menstrual period 07/12/2015.  Patient Vitals for the past 24 hrs:  BP Temp Temp src Pulse Resp Height Weight  04/21/16 2134 131/72 mmHg - - 79 - - -  04/21/16 2119 120/62 mmHg - - 68 - - -  04/21/16 2104 122/64 mmHg - - 71 - - -  04/21/16 2049 113/65 mmHg - - 80 - - -  04/21/16 2034 135/77 mmHg - - 74 - - -  04/21/16 2019 137/79 mmHg - - 78 - - -  04/21/16 2004 140/69 mmHg - - 75 - - -  04/21/16 1955 137/79 mmHg - - 83 - - -  04/21/16 1941 147/82 mmHg 98.3 F (36.8 C) Oral 88 18 5' 9.5" (1.765 m) 216 lb 12 oz (98.317 kg)    Physical Exam  Nursing note and vitals reviewed. Constitutional: She is oriented to person, place, and time. She appears well-developed and well-nourished. No distress.  HENT:  Head: Normocephalic and atraumatic.  Eyes: Conjunctivae are normal. Right eye exhibits no discharge. Left eye exhibits no discharge. No scleral icterus.  Neck: Normal range of motion.  Cardiovascular: Normal rate, regular rhythm and normal heart sounds.   No murmur heard. Respiratory: Effort normal and breath sounds normal. No respiratory distress. She has no wheezes.  GI: Soft. There is no tenderness.  Musculoskeletal: She exhibits edema (BLE).  Neurological: She is alert and oriented to person, place, and time. She has normal reflexes.  No clonus  Skin: Skin is warm and dry. She is not diaphoretic.  Psychiatric: She has a normal mood and affect. Her behavior is normal. Judgment and thought content normal.   Fetal Tracing:  Baseline: 120 Variability: moderate Accelerations: 15x15 Decelerations: none  Toco: every 15 mins   MAU Course  Procedures Results for orders placed or performed during the hospital encounter of 04/21/16 (from the past 24 hour(s))  Urinalysis, Routine w reflex microscopic (not at [facility])     Status: Abnormal   Collection Time: 04/21/16  7:44 PM  Result Value Ref Range   Color, Urine YELLOW YELLOW   APPearance CLEAR CLEAR   Specific  Gravity, Urine <1.005 (L) 1.005 - 1.030   pH 7.0 5.0 - 8.0   Glucose, UA NEGATIVE NEGATIVE mg/dL   Hgb urine dipstick NEGATIVE NEGATIVE   Bilirubin Urine NEGATIVE NEGATIVE   Ketones, ur NEGATIVE NEGATIVE mg/dL   Protein, ur NEGATIVE NEGATIVE mg/dL   Nitrite NEGATIVE NEGATIVE   Leukocytes, UA NEGATIVE NEGATIVE  Protein / creatinine ratio, urine     Status: None   Collection Time: 04/21/16  7:44 PM  Result Value Ref Range   Creatinine, Urine 71.00 mg/dL   Total Protein, Urine 8 mg/dL   Protein Creatinine Ratio 0.11 0.00 - 0.15 mg/mg[Cre]  CBC     Status: Abnormal   Collection Time: 04/21/16  8:04 PM  Result Value Ref Range   WBC 7.0 4.0 - 10.5 K/uL   RBC 3.72 (L) 3.87 - 5.11 MIL/uL   Hemoglobin 10.6 (L) 12.0 - 15.0 g/dL   HCT 31.8 (L) 36.0 - 46.0 %   MCV 85.5 78.0 - 100.0 fL   MCH 28.5 26.0 - 34.0 pg   MCHC 33.3 30.0 - 36.0 g/dL   RDW 13.4 11.5 - 15.5 %   Platelets 220 150 - 400 K/uL  Comprehensive metabolic panel     Status: Abnormal   Collection Time: 04/21/16  8:04 PM  Result Value Ref Range   Sodium 135 135 - 145 mmol/L   Potassium 3.8 3.5 - 5.1 mmol/L   Chloride 108 101 - 111 mmol/L   CO2 23 22 - 32 mmol/L   Glucose, Bld 95 65 - 99 mg/dL   BUN 9 6 - 20 mg/dL   Creatinine, Ser 0.64 0.44 - 1.00 mg/dL   Calcium 10.7 (H) 8.9 - 10.3 mg/dL   Total Protein 6.5 6.5 - 8.1 g/dL   Albumin 3.1 (L) 3.5 - 5.0 g/dL   AST 33 15 - 41 U/L   ALT 30 14 - 54 U/L   Alkaline Phosphatase 164 (H) 38 - 126 U/L   Total Bilirubin 0.7 0.3 - 1.2 mg/dL   GFR calc non Af Amer >60 >60 mL/min   GFR calc Af Amer >60 >60 mL/min   Anion gap 4 (L) 5 - 15    MDM Reactive fetal tracing No severe range BPs CBC, CMP, Protein creatinine ratio Labs normal Attempted to contacted Dr. Ilda Basset -- in the [place] care over to Dr. Elnoria Howard, NP 04/21/2016 9:59 PM  Assessment and Plan   Patient was admitted to LD for delivery given gestational HTN and over 39 weeks.

## 2016-04-21 NOTE — H&P (Signed)
OBSTETRIC ADMISSION HISTORY AND PHYSICAL  Casey Mayo is a 40 y.o. female [phone] with IUP at [redacted]w[redacted]d by LMP presenting for elevated BP in clinic. Was seen at [facility] and found to have mild range BP and was told to come to the hospital for evaluation. She arrived > 4 hours from her appointment and continued to have intermittent mild range blood pressures. Labs were negative for preeclampsia. She reports +FMs, No LOF, no VB, no blurry vision, headaches or peripheral edema, and RUQ pain.  She plans on breast feeding. She request Nexplanon for birth control.  Dating: By LMP --->  Estimated Date of Delivery: 04/17/16   Prenatal History/Complications:  Past Medical History: Past Medical History  Diagnosis Date  . Medical history non-contributory     Past Surgical History: History reviewed. No pertinent past surgical history.  Obstetrical History: OB History    Gravida Para Term Preterm AB TAB SAB Ectopic Multiple Living   6 3 3  2     3       Social History: Social History   Social History  . Marital Status: Married    Spouse Name: N/A  . Number of Children: N/A  . Years of Education: N/A   Social History Main Topics  . Smoking status: Never Smoker   . Smokeless tobacco: None  . Alcohol Use: No  . Drug Use: No  . Sexual Activity: Yes    Birth Control/ Protection: None     Comment: last intercourse Jul 09 2015   Other Topics Concern  . None   Social History Narrative    Family History: History reviewed. No pertinent family history.  Allergies: No Known Allergies  Prescriptions prior to admission  Medication Sig Dispense Refill Last Dose  . acetaminophen (TYLENOL) 325 MG tablet Take 650 mg by mouth every 6 (six) hours as needed for mild pain. Reported on 04/21/2016   04/20/2016 at Unknown time  . glycopyrrolate (ROBINUL) 2 MG tablet Take 2 mg by mouth 3 (three) times daily.   04/20/2016 at Unknown time  . Prenatal Vit-Fe Fumarate-FA (PRENATAL MULTIVITAMIN) TABS tablet Take 1  tablet by mouth daily at 12 noon.   Past Week at Unknown time  . ibuprofen (ADVIL,MOTRIN) 600 MG tablet Take 1 tablet (600 mg total) by mouth every 6 (six) hours as needed. (Patient not taking: Reported on 04/21/2016) 30 tablet 0      Review of Systems   All systems reviewed and negative except as stated in HPI  Blood pressure 131/72, pulse 79, temperature 98.3 F (36.8 C), temperature source Oral, resp. rate 18, height 5' 9.5" (1.765 m), weight 216 lb 12 oz (98.317 kg), last menstrual period 07/12/2015. General appearance: alert, cooperative and appears stated age Lungs: clear to auscultation bilaterally Heart: regular rate and rhythm Abdomen: soft, non-tender; bowel sounds normal Pelvic: adequate Extremities: Homans sign is negative, no sign of DVT DTR's wnl Presentation: cephalic Fetal monitoringBaseline: 120 bpm, Variability: Good {> 6 bpm), Accelerations: Reactive and Decelerations: Absent Uterine activity  Frequency: Every 10-15 minutes Dilation: 2 Effacement (%): 50 Station: -2 Exam by:: Lauretta Chester MD   Prenatal labs: ABO, Rh: --/--/O NEG, O NEG (09/15 1125) Antibody: NEG (09/15 1125) Rubella:   Immune  RPR:   NR HBsAg:   NEG HIV: Non Reactive (09/15 1125)  GBS: Negative (06/02 0000)  1 hr Glucola 106 Genetic screening  DSR 1:207 Anatomy US wnl  Prenatal Transfer Tool  Maternal Diabetes: No Genetic Screening: Abnormal:  Results: Other: Quad screen,  DSR 1:207 Maternal Ultrasounds/Referrals: Normal Fetal Ultrasounds or other Referrals:  None Maternal Substance Abuse:  No Significant Maternal Medications:  None Significant Maternal Lab Results: Lab values include: Group B Strep negative, Rh negative  Results for orders placed or performed during the hospital encounter of 04/21/16 (from the past 24 hour(s))  Urinalysis, Routine w reflex microscopic (not at [facility])   Collection Time: 04/21/16  7:44 PM  Result Value Ref Range   Color, Urine YELLOW YELLOW    APPearance CLEAR CLEAR   Specific Gravity, Urine <1.005 (L) 1.005 - 1.030   pH 7.0 5.0 - 8.0   Glucose, UA NEGATIVE NEGATIVE mg/dL   Hgb urine dipstick NEGATIVE NEGATIVE   Bilirubin Urine NEGATIVE NEGATIVE   Ketones, ur NEGATIVE NEGATIVE mg/dL   Protein, ur NEGATIVE NEGATIVE mg/dL   Nitrite NEGATIVE NEGATIVE   Leukocytes, UA NEGATIVE NEGATIVE  Protein / creatinine ratio, urine   Collection Time: 04/21/16  7:44 PM  Result Value Ref Range   Creatinine, Urine 71.00 mg/dL   Total Protein, Urine 8 mg/dL   Protein Creatinine Ratio 0.11 0.00 - 0.15 mg/mg[Cre]  CBC   Collection Time: 04/21/16  8:04 PM  Result Value Ref Range   WBC 7.0 4.0 - 10.5 K/uL   RBC 3.72 (L) 3.87 - 5.11 MIL/uL   Hemoglobin 10.6 (L) 12.0 - 15.0 g/dL   HCT 31.8 (L) 36.0 - 46.0 %   MCV 85.5 78.0 - 100.0 fL   MCH 28.5 26.0 - 34.0 pg   MCHC 33.3 30.0 - 36.0 g/dL   RDW 13.4 11.5 - 15.5 %   Platelets 220 150 - 400 K/uL  Comprehensive metabolic panel   Collection Time: 04/21/16  8:04 PM  Result Value Ref Range   Sodium 135 135 - 145 mmol/L   Potassium 3.8 3.5 - 5.1 mmol/L   Chloride 108 101 - 111 mmol/L   CO2 23 22 - 32 mmol/L   Glucose, Bld 95 65 - 99 mg/dL   BUN 9 6 - 20 mg/dL   Creatinine, Ser 0.64 0.44 - 1.00 mg/dL   Calcium 10.7 (H) 8.9 - 10.3 mg/dL   Total Protein 6.5 6.5 - 8.1 g/dL   Albumin 3.1 (L) 3.5 - 5.0 g/dL   AST 33 15 - 41 U/L   ALT 30 14 - 54 U/L   Alkaline Phosphatase 164 (H) 38 - 126 U/L   Total Bilirubin 0.7 0.3 - 1.2 mg/dL   GFR calc non Af Amer >60 >60 mL/min   GFR calc Af Amer >60 >60 mL/min   Anion gap 4 (L) 5 - 15    There are no active problems to display for this patient.   Assessment/Plan:  Casey Mayo is a 40 y.o. [id number] at [redacted]w[redacted]d here for IOL due to gestational HTN  #Labor: Cytotec 73mcg q4 hours, plan for 1-2 doses and then likely pitocin #Pain: Prn medications #FWB:  Cat I #ID:  GBS neg #MOF: Breast #MOC: Nexplanon #Circ:  Boy, outpatient circ  #Gestational HTN: no  s/sx of preeclampsia. Monitor BP closely  #Rh Neg: will need Rh neg work up and possibly rhogam pp  Caren Macadam, MD  04/21/2016, 10:47 PM

## 2016-04-22 ENCOUNTER — Encounter (HOSPITAL_COMMUNITY): Payer: Self-pay

## 2016-04-22 DIAGNOSIS — O134 Gestational [pregnancy-induced] hypertension without significant proteinuria, complicating childbirth: Secondary | ICD-10-CM

## 2016-04-22 DIAGNOSIS — Z3A40 40 weeks gestation of pregnancy: Secondary | ICD-10-CM

## 2016-04-22 LAB — TYPE AND SCREEN
ABO/RH(D): O NEG
ANTIBODY SCREEN: NEGATIVE

## 2016-04-22 LAB — RPR: RPR Ser Ql: NONREACTIVE

## 2016-04-22 MED ORDER — FENTANYL 2.5 MCG/ML BUPIVACAINE 1/10 % EPIDURAL INFUSION (WH - ANES)
14.0000 mL/h | INTRAMUSCULAR | Status: DC | PRN
Start: 2016-04-22 — End: 2016-04-22
  Filled 2016-04-22: qty 125

## 2016-04-22 MED ORDER — DIPHENHYDRAMINE HCL 25 MG PO CAPS: 25.0000 mg | ORAL_CAPSULE | Freq: Four times a day (QID) | ORAL | Status: DC | PRN

## 2016-04-22 MED ORDER — ACETAMINOPHEN 325 MG PO TABS: 650.0000 mg | ORAL_TABLET | ORAL | Status: DC | PRN

## 2016-04-22 MED ORDER — PRENATAL MULTIVITAMIN CH
1.0000 | ORAL_TABLET | Freq: Every day | ORAL | Status: DC
  Administered 2016-04-22 – 2016-04-23 (×2): 1 via ORAL
  Filled 2016-04-22 (×2): qty 1

## 2016-04-22 MED ORDER — IBUPROFEN 600 MG PO TABS
600.0000 mg | ORAL_TABLET | Freq: Four times a day (QID) | ORAL | Status: DC
  Administered 2016-04-22 – 2016-04-23 (×6): 600 mg via ORAL
  Filled 2016-04-22 (×6): qty 1

## 2016-04-22 MED ORDER — ONDANSETRON HCL 4 MG PO TABS: 4.0000 mg | ORAL_TABLET | ORAL | Status: DC | PRN

## 2016-04-22 MED ORDER — PHENYLEPHRINE 40 MCG/ML (10ML) SYRINGE FOR IV PUSH (FOR BLOOD PRESSURE SUPPORT)
80.0000 ug | PREFILLED_SYRINGE | INTRAVENOUS | Status: DC | PRN
  Filled 2016-04-22: qty 10

## 2016-04-22 MED ORDER — TETANUS-DIPHTH-ACELL PERTUSSIS 5-2.5-18.5 LF-MCG/0.5 IM SUSP
0.5000 mL | Freq: Once | INTRAMUSCULAR | Status: AC
  Administered 2016-04-23: 0.5 mL via INTRAMUSCULAR
  Filled 2016-04-22: qty 0.5

## 2016-04-22 MED ORDER — BENZOCAINE-MENTHOL 20-0.5 % EX AERO: 1.0000 "application " | INHALATION_SPRAY | CUTANEOUS | Status: DC | PRN

## 2016-04-22 MED ORDER — RHO D IMMUNE GLOBULIN 1500 UNIT/2ML IJ SOSY
300.0000 ug | PREFILLED_SYRINGE | Freq: Once | INTRAMUSCULAR | Status: DC
  Filled 2016-04-22: qty 2

## 2016-04-22 MED ORDER — WITCH HAZEL-GLYCERIN EX PADS: 1.0000 "application " | MEDICATED_PAD | CUTANEOUS | Status: DC | PRN

## 2016-04-22 MED ORDER — PHENYLEPHRINE 40 MCG/ML (10ML) SYRINGE FOR IV PUSH (FOR BLOOD PRESSURE SUPPORT)
80.0000 ug | PREFILLED_SYRINGE | INTRAVENOUS | Status: DC | PRN
Start: 2016-04-22 — End: 2016-04-22

## 2016-04-22 MED ORDER — DIBUCAINE 1 % RE OINT: 1.0000 "application " | TOPICAL_OINTMENT | RECTAL | Status: DC | PRN

## 2016-04-22 MED ORDER — MAGNESIUM SULFATE 50 % IJ SOLN
2.0000 g/h | INTRAMUSCULAR | Status: DC
  Administered 2016-04-22 – 2016-04-23 (×2): 2 g/h via INTRAVENOUS
  Filled 2016-04-22 (×2): qty 80

## 2016-04-22 MED ORDER — LABETALOL HCL 5 MG/ML IV SOLN: 20.0000 mg | INTRAVENOUS | Status: DC | PRN

## 2016-04-22 MED ORDER — EPHEDRINE 5 MG/ML INJ: 10.0000 mg | INTRAVENOUS | Status: DC | PRN

## 2016-04-22 MED ORDER — MAGNESIUM SULFATE BOLUS VIA INFUSION
4.0000 g | Freq: Once | INTRAVENOUS | Status: AC
  Administered 2016-04-22: 4 g via INTRAVENOUS
  Filled 2016-04-22: qty 500

## 2016-04-22 MED ORDER — DIPHENHYDRAMINE HCL 50 MG/ML IJ SOLN: 12.5000 mg | INTRAMUSCULAR | Status: DC | PRN

## 2016-04-22 MED ORDER — LACTATED RINGERS IV SOLN: 500.0000 mL | Freq: Once | INTRAVENOUS | Status: DC

## 2016-04-22 MED ORDER — LACTATED RINGERS IV SOLN
INTRAVENOUS | Status: DC
  Administered 2016-04-22: 22:00:00 via INTRAVENOUS

## 2016-04-22 MED ORDER — COCONUT OIL OIL: 1.0000 "application " | TOPICAL_OIL | Status: DC | PRN

## 2016-04-22 MED ORDER — SIMETHICONE 80 MG PO CHEW: 80.0000 mg | CHEWABLE_TABLET | ORAL | Status: DC | PRN

## 2016-04-22 MED ORDER — SENNOSIDES-DOCUSATE SODIUM 8.6-50 MG PO TABS
2.0000 | ORAL_TABLET | ORAL | Status: DC
  Administered 2016-04-22: 2 via ORAL
  Filled 2016-04-22: qty 2

## 2016-04-22 MED ORDER — ZOLPIDEM TARTRATE 5 MG PO TABS: 5.0000 mg | ORAL_TABLET | Freq: Every evening | ORAL | Status: DC | PRN

## 2016-04-22 MED ORDER — HYDRALAZINE HCL 20 MG/ML IJ SOLN: 10.0000 mg | Freq: Once | INTRAMUSCULAR | Status: DC | PRN

## 2016-04-22 MED ORDER — ONDANSETRON HCL 4 MG/2ML IJ SOLN: 4.0000 mg | INTRAMUSCULAR | Status: DC | PRN

## 2016-04-22 NOTE — Progress Notes (Signed)
Pt to eat then induction to be started.

## 2016-04-22 NOTE — Lactation Note (Signed)
This note was copied from a baby's chart. Lactation Consultation Note  Patient Name: Casey Mayo [id number] Date: 04/22/2016 Reason for consult: Initial assessment   Initial consult with Exp BF mom. Mom on the phone twice while I was in the room, had visitors, and baby was getting his bath. Mom reports she feels BF is going well. She was able to show me hand expression. Breastfeeding Resources Handout and [place] Brochure given, informed of BF Support Groups, [place] phone # and OP Services. Mom without questions at this time. Enc her to call for assistance as needed.    Maternal Data Formula Feeding for Exclusion: No Has patient been taught Hand Expression?: Yes (Patient was able to demonstrate) Does the patient have breastfeeding experience prior to this delivery?: Yes  Feeding Feeding Type: Breast Milk Length of feed: 20 min  LATCH Score/Interventions Latch: Repeated attempts needed to sustain latch, nipple held in mouth throughout feeding, stimulation needed to elicit sucking reflex. Intervention(s): Adjust position;Assist with latch  Audible Swallowing: A few with stimulation Intervention(s): Skin to skin  Type of Nipple: Everted at rest and after stimulation  Comfort (Breast/Nipple): Soft / non-tender     Hold (Positioning): Assistance needed to correctly position infant at breast and maintain latch.  LATCH Score: 7  Lactation Tools Discussed/Used WIC Program: Yes   Consult Status Consult Status: Follow-up Date: 04/23/16 Follow-up type: In-patient    Casey Mayo 04/22/2016, 2:15 PM

## 2016-04-22 NOTE — Progress Notes (Signed)
Labor Progress Note Casey Mayo is a 40 y.o. [id number] at [redacted]w[redacted]d presented for IOL for gHTN S: reports some mild contractions. No other issues  O:  BP 143/53 mmHg  Pulse 68  Temp(Src) 98 F (36.7 C) (Oral)  Resp 18  Ht 5' 9.5" (1.765 m)  Wt 216 lb 12 oz (98.317 kg)  BMI 31.56 kg/m2  LMP 07/12/2015 EFM: 130/mod var/no decels  CVE: Dilation: 3 Effacement (%): 70 Cervical Position: Posterior Station: -1 Presentation: Vertex Exam by:: Mickie Badders MD   A&P: 40 y.o. [id number] [redacted]w[redacted]d admitted for IOL for gHTN.  #gHTN: mild range BP's. PIH labs neg #Labor: s/p cytotec at [place]. progressing #Pain: as needed pain meds for now #FWB: CAT-1 #GBS: negative  Mercy Riding, MD 6:38 AM

## 2016-04-23 LAB — CBC
HCT: 28.2 % — ABNORMAL LOW (ref 36.0–46.0)
HEMOGLOBIN: 9.5 g/dL — AB (ref 12.0–15.0)
MCH: 28.6 pg (ref 26.0–34.0)
MCHC: 33.7 g/dL (ref 30.0–36.0)
MCV: 84.9 fL (ref 78.0–100.0)
Platelets: 197 10*3/uL (ref 150–400)
RBC: 3.32 MIL/uL — ABNORMAL LOW (ref 3.87–5.11)
RDW: 13.5 % (ref 11.5–15.5)
WBC: 9.2 10*3/uL (ref 4.0–10.5)

## 2016-04-23 MED ORDER — AMLODIPINE BESYLATE 10 MG PO TABS: 10.0000 mg | ORAL_TABLET | Freq: Every day | ORAL | Status: DC

## 2016-04-23 MED ORDER — RHO D IMMUNE GLOBULIN 1500 UNIT/2ML IJ SOSY
300.0000 ug | PREFILLED_SYRINGE | Freq: Once | INTRAMUSCULAR | Status: AC
  Administered 2016-04-23: 300 ug via INTRAVENOUS
  Filled 2016-04-23: qty 2

## 2016-04-23 NOTE — Progress Notes (Signed)
DC instructions explained to pt with understanding. CN called, spoke with Patty, rn to remove cord clamp and hugs. Pt and family went to CN accompanied by NS Christina.

## 2016-04-23 NOTE — Discharge Summary (Addendum)
OB Discharge Summary     Patient Name: Casey Mayo DOB: 23-Dec-1975 MRN: [id number]  Date of admission: 04/21/2016 Delivering MD: Laurey Arrow [place]   Date of discharge: 04/23/2016  Admitting diagnosis: elevated bp severe range HTN Intrauterine pregnancy: [redacted]w[redacted]d     Secondary diagnosis:  Active Problems:   Gestational hypertension   NSVD (normal spontaneous vaginal delivery)  Additional problems: severe range bp in labor warranting Magnesium sulfate x 24 hrs.     Discharge diagnosis: Term Pregnancy Delivered                                        Severe range gestational hypertension                                                                                                                           Post partum procedures:none  Augmentation: AROM, Pitocin and Cytotec  Complications: None  Hospital course:  Onset of Labor With Vaginal Delivery     40 y.o. yo [id number] at [redacted]w[redacted]d was admitted for induction on 04/21/2016.Casey Mayo is a 40 y.o. female [phone] with IUP at [redacted]w[redacted]d by LMP presenting for elevated BP in clinic. Was seen at HD and found to have mild range BP and was told to come to the hospital for evaluation. She arrived > 4 hours from her appointment and continued to have intermittent mild range blood pressures. Labs were negative for preeclampsia. She reports +FMs, No LOF, no VB, no blurry vision, headaches or peripheral edema, and RUQ pain. She plans on breast feeding. She request Nexplanon for birth control. Patient had a straightforward labor course as follows: Cytotec followed by pitocin then vaginal delivery,  Membrane Rupture Time/Date: 8:11 AM ,04/22/2016   Intrapartum Procedures: Episiotomy: None [1]                                         Lacerations:  1st degree [2]  Patient had a delivery of a Viable infant. 04/22/2016  Information for the patient's newborn:  Margaree, Trinka [id number]  Delivery Method: Vag-Spont    Pateint had an uncomplicated postpartum  course.  She is ambulating, tolerating a regular diet, passing flatus, and urinating well. Patient is discharged home in stable condition on 04/23/2016.    Physical exam  Filed Vitals:   04/23/16 0551 04/23/16 0621 04/23/16 1000 04/23/16 1400  BP: 122/72  126/74 142/86  Pulse: 80  70 61  Temp: 98 F (36.7 C)  98.7 F (37.1 C) 97.6 F (36.4 C)  TempSrc:    Oral  Resp: 16 18 20 20   Height:      Weight: 216 lb 12 oz (98.317 kg)     SpO2: 100% 100% 99% 100%   General: alert and no distress Lochia: appropriate  Uterine Fundus: firm at u-2 Incision:  DVT Evaluation: No evidence of DVT seen on physical exam. Labs: Lab Results  Component Value Date   WBC 9.2 04/23/2016   HGB 9.5* 04/23/2016   HCT 28.2* 04/23/2016   MCV 84.9 04/23/2016   PLT 197 04/23/2016   CMP Latest Ref Rng 04/21/2016  Glucose 65 - 99 mg/dL 95  BUN 6 - 20 mg/dL 9  Creatinine 0.44 - 1.00 mg/dL 0.64  Sodium 135 - 145 mmol/L 135  Potassium 3.5 - 5.1 mmol/L 3.8  Chloride 101 - 111 mmol/L 108  CO2 22 - 32 mmol/L 23  Calcium 8.9 - 10.3 mg/dL 10.7(H)  Total Protein 6.5 - 8.1 g/dL 6.5  Total Bilirubin 0.3 - 1.2 mg/dL 0.7  Alkaline Phos 38 - 126 U/L 164(H)  AST 15 - 41 U/L 33  ALT 14 - 54 U/L 30    Discharge instruction: per After Visit Summary and "Baby and Me Booklet".  After visit meds:    Medication List    ASK your doctor about these medications        acetaminophen 325 MG tablet  Commonly known as:  TYLENOL  Take 650 mg by mouth every 6 (six) hours as needed for mild pain. Reported on 04/21/2016     glycopyrrolate 2 MG tablet  Commonly known as:  ROBINUL  Take 2 mg by mouth 3 (three) times daily.     ibuprofen 600 MG tablet  Commonly known as:  ADVIL,MOTRIN  Take 1 tablet (600 mg total) by mouth every 6 (six) hours as needed.     prenatal multivitamin Tabs tablet  Take 1 tablet by mouth daily at 12 noon.        Diet: routine diet  Activity: Advance as tolerated. Pelvic rest for 6  weeks.   Outpatient follow up:4 wk at [facility] baby love to see pt for bp in 1 wk Follow up Appt:No future appointments. Follow up Visit:No Follow-up on file. Bby love to see pt one week Postpartum contraception: nexplanon at [facility]  Newborn Data: Live born female  Birth Weight: 7 lb 4.1 oz (3291 g) APGAR: 9, 9  Baby Feeding: Breast Disposition:home with mother   04/23/2016 Jonnie Kind, MD

## 2016-04-23 NOTE — Progress Notes (Signed)
POSTPARTUM PROGRESS NOTE  Post Partum Day 2 Subjective:  Casey Mayo is a 40 y.o. [id number] [redacted]w[redacted]d s/p nsvd.  No acute events overnight.  Pt denies problems with ambulating, voiding or po intake.  She denies nausea or vomiting.  Pain is well controlled.  She has had flatus. She has not had bowel movement.  Lochia Small. No ha, vision change, upper abdominal pain, or sob  Objective: Blood pressure 122/72, pulse 80, temperature 98 F (36.7 C), temperature source Oral, resp. rate 16, height 5' 9.5" (1.765 m), weight 216 lb 12 oz (98.317 kg), last menstrual period 07/12/2015, SpO2 100 %, unknown if currently breastfeeding.  Physical Exam:  General: alert, cooperative and no distress Lochia:normal flow Chest: CTAB Heart: RRR no m/r/g Abdomen: +BS, soft, nontender,  Uterine Fundus: firm,  DVT Evaluation: No calf swelling or tenderness Extremities: trace edema   Recent Labs  04/21/16 2004 04/23/16 0431  HGB 10.6* 9.5*  HCT 31.8* 28.2*    Assessment/Plan:  ASSESSMENT: Casey Mayo is a 40 y.o. [id number] [redacted]w[redacted]d s/p nsvd, doing well. Almost done w/ 24 hours pp mg due to ghtn with severe range bps. PP BPs have been wnl. Will monitor bp today and if appropriate pt would like d/c this PM.    LOS: 2 days   Desma Maxim 04/23/2016, 6:01 AM

## 2016-04-23 NOTE — Lactation Note (Signed)
This note was copied from a baby's chart. Lactation Consultation Note  Patient Name: Casey Mayo [id number] Date: 04/23/2016 Reason for consult: Follow-up assessment   Follow up with Exp BF mom of 56 hour old infant. Infant with 3 BF for 15-50 minutes, 1 attempt, 3 bottle feeds od 21-25 cc, 3 voids and 2 stool in last 24 hours. LATCH Scores 7 by bedside RN. Infant weight 7 lb 0.7 oz with weight loss of 3% since birth.   Mom reports BF is going well. Infant cluster fed during the night. Mom reports she has no milk and is giving formula. She attempted to hand express with no colostrum visible. Enc mom to BF 8-12 x in 24 hours before formula. Mom denies nipple tenderness and breast feeling fuller.  Follow up tomorrow and prn. Enc mom to call if assistance is needed.    Maternal Data Formula Feeding for Exclusion: No Has patient been taught Hand Expression?: Yes Does the patient have breastfeeding experience prior to this delivery?: Yes  Feeding    LATCH Score/Interventions                      Lactation Tools Discussed/Used     Consult Status Consult Status: Follow-up Date: 04/24/16 Follow-up type: In-patient    Debby Freiberg Rashunda Passon 04/23/2016, 9:18 AM

## 2016-04-23 NOTE — Progress Notes (Signed)
Notified Nursery of new orders and hearing screen incomplete.

## 2016-04-24 ENCOUNTER — Inpatient Hospital Stay (HOSPITAL_COMMUNITY): Payer: Medicaid Other

## 2016-04-24 LAB — RH IG WORKUP (INCLUDES ABO/RH)
ABO/RH(D): O NEG
Fetal Screen: NEGATIVE
GESTATIONAL AGE(WKS): 40
Unit division: 0

## 2016-06-14 ENCOUNTER — Other Ambulatory Visit: Payer: Self-pay | Admitting: Obstetrics and Gynecology

## 2016-12-04 ENCOUNTER — Encounter (HOSPITAL_COMMUNITY): Payer: Self-pay

## 2016-12-04 ENCOUNTER — Emergency Department (HOSPITAL_COMMUNITY)
Admission: EM | Admit: 2016-12-04 | Discharge: 2016-12-04 | Disposition: A | Discharge status: Home / Self Care | Payer: Medicaid Other | Attending: Emergency Medicine | Admitting: Emergency Medicine

## 2016-12-04 ENCOUNTER — Other Ambulatory Visit: Payer: Self-pay

## 2016-12-04 ENCOUNTER — Emergency Department (HOSPITAL_COMMUNITY): Payer: Medicaid Other

## 2016-12-04 DIAGNOSIS — R1310 Dysphagia, unspecified: Secondary | ICD-10-CM

## 2016-12-04 DIAGNOSIS — Z79899 Other long term (current) drug therapy: Secondary | ICD-10-CM | POA: Insufficient documentation

## 2016-12-04 LAB — CBC WITH DIFFERENTIAL/PLATELET
BASOS PCT: 0 %
Basophils Absolute: 0 10*3/uL (ref 0.0–0.1)
EOS ABS: 0 10*3/uL (ref 0.0–0.7)
EOS PCT: 1 %
HCT: 41.3 % (ref 36.0–46.0)
Hemoglobin: 13.8 g/dL (ref 12.0–15.0)
Lymphocytes Relative: 38 %
Lymphs Abs: 2.2 10*3/uL (ref 0.7–4.0)
MCH: 29.2 pg (ref 26.0–34.0)
MCHC: 33.4 g/dL (ref 30.0–36.0)
MCV: 87.5 fL (ref 78.0–100.0)
MONO ABS: 0.4 10*3/uL (ref 0.1–1.0)
MONOS PCT: 7 %
Neutro Abs: 3.2 10*3/uL (ref 1.7–7.7)
Neutrophils Relative %: 54 %
Platelets: 284 10*3/uL (ref 150–400)
RBC: 4.72 MIL/uL (ref 3.87–5.11)
RDW: 12.5 % (ref 11.5–15.5)
WBC: 5.8 10*3/uL (ref 4.0–10.5)

## 2016-12-04 LAB — COMPREHENSIVE METABOLIC PANEL
ALK PHOS: 83 U/L (ref 38–126)
ALT: 16 U/L (ref 14–54)
ANION GAP: 6 (ref 5–15)
AST: 18 U/L (ref 15–41)
Albumin: 4.5 g/dL (ref 3.5–5.0)
BILIRUBIN TOTAL: 0.9 mg/dL (ref 0.3–1.2)
BUN: 10 mg/dL (ref 6–20)
CALCIUM: 11.2 mg/dL — AB (ref 8.9–10.3)
CO2: 27 mmol/L (ref 22–32)
Chloride: 107 mmol/L (ref 101–111)
Creatinine, Ser: 0.9 mg/dL (ref 0.44–1.00)
GFR calc non Af Amer: >60 mL/min (ref >60)
Glucose, Bld: 102 mg/dL — ABNORMAL HIGH (ref 65–99)
Potassium: 3.8 mmol/L (ref 3.5–5.1)
SODIUM: 140 mmol/L (ref 135–145)
TOTAL PROTEIN: 7.9 g/dL (ref 6.5–8.1)

## 2016-12-04 LAB — I-STAT TROPONIN, ED: TROPONIN I, POC: 0 ng/mL (ref 0.00–0.08)

## 2016-12-04 LAB — LIPASE, BLOOD: LIPASE: 40 U/L (ref 11–51)

## 2016-12-04 MED ORDER — OMEPRAZOLE 20 MG PO CPDR: 20.0000 mg | DELAYED_RELEASE_CAPSULE | Freq: Every day | ORAL | 0 refills | Status: DC

## 2016-12-04 NOTE — ED Provider Notes (Signed)
[facility] DEPT Provider Note   CSN: [id number] Arrival date & time: 12/04/16  [id number]     History   Chief Complaint Chief Complaint  Patient presents with  . Gastroesophageal Reflux  . Chest Pain    HPI Casey Mayo is a 41 y.o. female.  HPI Patient presents with 2 weeks of chest discomfort mostly after eating. This resolved spontaneously. No associated shortness of breath or cough. Denies any chest discomfort currently. States she's been taking over-the-counter PPI daily with little improvement of her symptoms. She also states these symptoms are exacerbated by drinking black coffee. Denies NSAID use. Denies abdominal pain. No nausea vomiting or diarrhea. No melanotic grossly bloody stools. No fever or chills.  Past Medical History:  Diagnosis Date  . Medical history non-contributory     Patient Active Problem List   Diagnosis Date Noted  . NSVD (normal spontaneous vaginal delivery) 04/22/2016  . Gestational hypertension 04/21/2016    History reviewed. No pertinent surgical history.  OB History    Gravida Para Term Preterm AB Living   6 4 4   2 1    SAB TAB Ectopic Multiple Live Births         0 1       Home Medications    Prior to Admission medications   Medication Sig Start Date End Date Taking? Authorizing Provider  acetaminophen (TYLENOL) 325 MG tablet Take 650 mg by mouth every 6 (six) hours as needed for mild pain. Reported on 04/21/2016    Historical Provider, MD  amLODipine (NORVASC) 10 MG tablet TAKE 1 TABLET BY MOUTH EVERY DAY 06/17/16   Jonnie Kind, MD  omeprazole (PRILOSEC) 20 MG capsule Take 1 capsule (20 mg total) by mouth daily. 12/04/16   Julianne Rice, MD  Prenatal Vit-Fe Fumarate-FA (PRENATAL MULTIVITAMIN) TABS tablet Take 1 tablet by mouth daily at 12 noon.    Historical Provider, MD    Family History History reviewed. No pertinent family history.  Social History Social History  Substance Use Topics  . Smoking status: Never Smoker  .  Smokeless tobacco: Never Used  . Alcohol use No     Allergies   Patient has no known allergies.   Review of Systems Review of Systems  Constitutional: Negative for chills and fever.  HENT: Negative for sore throat and trouble swallowing.   Respiratory: Positive for chest tightness. Negative for cough and shortness of breath.   Cardiovascular: Positive for chest pain. Negative for palpitations.  Gastrointestinal: Negative for abdominal pain, nausea and vomiting.  Genitourinary: Negative for dysuria, flank pain, frequency and pelvic pain.  Musculoskeletal: Negative for back pain, myalgias and neck pain.  Skin: Negative for rash and wound.  Neurological: Negative for dizziness, weakness, light-headedness and numbness.  All other systems reviewed and are negative.    Physical Exam Updated Vital Signs There were no vitals taken for this visit.  Physical Exam  Constitutional: She is oriented to person, place, and time. She appears well-developed and well-nourished.  HENT:  Head: Normocephalic and atraumatic.  Mouth/Throat: Oropharyngeal exudate present.  Eyes: EOM are normal. Pupils are equal, round, and reactive to light.  Neck: Normal range of motion. Neck supple.  Cardiovascular: Normal rate and regular rhythm.  Exam reveals no gallop and no friction rub.   No murmur heard. Pulmonary/Chest: Effort normal and breath sounds normal. No respiratory distress. She has no wheezes. She has no rales. She exhibits no tenderness.  Abdominal: Soft. Bowel sounds are normal. She exhibits no distension and  no mass. There is no tenderness. There is no rebound and no guarding. No hernia.  Musculoskeletal: Normal range of motion. She exhibits no edema or tenderness.  No lower extremity swelling, asymmetry or tenderness. Distal pulses equal.  Neurological: She is alert and oriented to person, place, and time.  Skin: Skin is warm and dry. Capillary refill takes less than 2 seconds. No rash noted.  No erythema.  Psychiatric: She has a normal mood and affect. Her behavior is normal.  Nursing note and vitals reviewed.    ED Treatments / Results  Labs (all labs ordered are listed, but only abnormal results are displayed) Labs Reviewed  COMPREHENSIVE METABOLIC PANEL - Abnormal; Notable for the following:       Result Value   Glucose, Bld 102 (*)    Calcium 11.2 (*)    All other components within normal limits  CBC WITH DIFFERENTIAL/PLATELET  LIPASE, BLOOD  I-STAT TROPOININ, ED    EKG  EKG Interpretation  Date/Time:  Thursday December 04 2016 07:50:27 EST Ventricular Rate:  80 PR Interval:    QRS Duration: 75 QT Interval:  338 QTC Calculation: 390 R Axis:   49 Text Interpretation:  Sinus rhythm Baseline wander in lead(s) V3 Confirmed by Jeneen Rinks  MD, [place] [facility] ([id number]) on 12/04/2016 7:56:57 AM       Radiology Dg Chest 2 View  Result Date: 12/04/2016 CLINICAL DATA:  Chest discomfort and heavy sensation postprandially for the past 2 weeks. EXAM: CHEST  2 VIEW COMPARISON:  None impacts FINDINGS: The lungs are adequately inflated and clear. The heart and pulmonary vascularity are normal. The mediastinum is normal in width. There is no pleural effusion. The trachea is midline. The bony thorax exhibits no acute abnormality. The gas pattern in the upper abdomen is normal. IMPRESSION: There is no active cardiopulmonary disease. Electronically Signed   By: Matteson Blue  [place] M.D.   On: 12/04/2016 09:03    Procedures Procedures (including critical care time)  Medications Ordered in ED Medications - No data to display   Initial Impression / Assessment and Plan / ED Course  I have reviewed the triage vital signs and the nursing notes.  Pertinent labs & imaging results that were available during my care of the patient were reviewed by me and considered in my medical decision making (see chart for details).    Patient remained symptom-free in the emergency department. Vital signs, left her  workup, EKG and chest x-ray are unremarkable. Suspect odynophagia. Advised to continue PPI and will give referral to gastroenterology. Suspicion for coronary artery disease. No significant risk factors. Return precautions have been given.   Final Clinical Impressions(s) / ED Diagnoses   Final diagnoses:  Odynophagia    New Prescriptions New Prescriptions   OMEPRAZOLE (PRILOSEC) 20 MG CAPSULE    Take 1 capsule (20 mg total) by mouth daily.     Julianne Rice, MD 12/04/16 [phone]

## 2016-12-04 NOTE — ED Triage Notes (Signed)
Pt states that when she eats she feels "heaviness" in chest.  Pt has had symptoms x 2 weeks.  Went last week and had EKG normal.  Pt was not given meds at that time but has bought OTC.  Meds not helping.  Pt has been also having headaches when ingesting caffeine.  No cough/congestion.

## 2017-10-16 ENCOUNTER — Other Ambulatory Visit: Payer: Self-pay | Admitting: Obstetrics & Gynecology

## 2017-10-16 DIAGNOSIS — Z1231 Encounter for screening mammogram for malignant neoplasm of breast: Secondary | ICD-10-CM

## 2017-11-12 ENCOUNTER — Ambulatory Visit
Admission: RE | Admit: 2017-11-12 | Discharge: 2017-11-12 | Disposition: A | Discharge status: Home / Self Care | Payer: No Typology Code available for payment source | Source: Ambulatory Visit | Attending: Obstetrics & Gynecology | Admitting: Obstetrics & Gynecology

## 2017-11-12 DIAGNOSIS — Z1231 Encounter for screening mammogram for malignant neoplasm of breast: Secondary | ICD-10-CM

## 2018-09-22 ENCOUNTER — Ambulatory Visit: Payer: Self-pay | Admitting: Internal Medicine

## 2018-09-29 ENCOUNTER — Encounter: Payer: Self-pay | Admitting: Internal Medicine

## 2018-09-29 ENCOUNTER — Ambulatory Visit: Payer: Self-pay | Admitting: Internal Medicine

## 2018-09-29 VITALS — BP 130/80 | HR 66 | Resp 12 | Ht 71.0 in | Wt 210.0 lb

## 2018-09-29 DIAGNOSIS — M542 Cervicalgia: Secondary | ICD-10-CM | POA: Insufficient documentation

## 2018-09-29 DIAGNOSIS — H547 Unspecified visual loss: Secondary | ICD-10-CM

## 2018-09-29 DIAGNOSIS — E010 Iodine-deficiency related diffuse (endemic) goiter: Secondary | ICD-10-CM | POA: Insufficient documentation

## 2018-09-29 DIAGNOSIS — O133 Gestational [pregnancy-induced] hypertension without significant proteinuria, third trimester: Secondary | ICD-10-CM

## 2018-09-29 DIAGNOSIS — R252 Cramp and spasm: Secondary | ICD-10-CM

## 2018-09-29 MED ORDER — B-100 COMPLEX PO TABS: ORAL_TABLET | ORAL | 0 refills | Status: DC

## 2018-09-29 MED ORDER — IBUPROFEN 200 MG PO TABS: ORAL_TABLET | ORAL | 0 refills | Status: DC

## 2018-09-29 NOTE — Patient Instructions (Addendum)
Can google "advance directives, [place]"  And bring up form from Secretary of [place]. Print and fill out Or can go to "5 wishes"  Which is also in Spanish and fill out--this costs $5--perhaps easier to use. Designate a Forensic scientist to speak for you if you are unable to speak for yourself when ill or injured   Using heating pad on medium setting for 20 minutes before bedtime nightly while reading.  Then unplug and do stretches to both shoulder 10 times for 10 secs, side of chin to chest 10 times for 10 seconds, and drop head straight down 10 times for 10 seconds.  Do not hold phone with shoulder Do not use shoulder bag  Call if you do not hear from physical therapy in 2-4 weeks.   [facility]:  [phone]  [place] soap bar between sheets at foot of bed

## 2018-09-29 NOTE — Progress Notes (Signed)
Subjective:    Patient ID: Casey Mayo, female    DOB: 1976-08-21, 42 y.o.   MRN: [id number]  HPI   Here to establish  1.  Started bilaterally in high thoracic, cervical area starting about 7.5 months ago.  She states it gradually worsened and has moved to just the right side with no radiation down her right arm. No history of injury.   States the pain just gradually came on. She was using a heavy shoulder bag, but has removed much of the contents to make lighter.  Uses on left shoulder. She also hold her cell phone sometimes with her left shoulder, but has moved to using the speaker instead of having her ear against the phone.   She was seen at [facility] on [address] 05/25/2018.  She had had the pain for abou 2 months at that point.  No radiological studies ordered at that time.  She was given a prescription for gabapentin 300 mg to take 1-2 caps at bedtime, she states as needed.  Her bottle states 1 cap 3 times daily.  She has only been using as needed.  She has not tried any other otc med, nor has she been referred to PT.   2. Foot cramps:  Only if sitting for a while and then gets up to walk.  Has had this for years.   Does not occur when she is sleeping.   She last had lab tests at [facility] last year and all from what she understands was normal.  3.  Decreased visual acuity.  Was using reading glasses, but they are no longer working for her.  Would like an eye appt and voucher for new glasses.  Current Meds  Medication Sig  . aspirin EC 81 MG tablet Take 81 mg by mouth daily.  Marland Kitchen gabapentin (NEURONTIN) 300 MG capsule Take 300 mg by mouth 3 (three) times daily.  Marland Kitchen omeprazole (PRILOSEC) 20 MG capsule Take 1 capsule (20 mg total) by mouth daily.    No Known Allergies   Past Medical History:  Diagnosis Date  . Medical history non-contributory   . Pregnancy induced hypertension 2017   previous 3 pregnancy without bp problems    No past surgical history on file.   No family history on  file.   Social History   Socioeconomic History  . Marital status: Married    Spouse name: Ibra Hadden  . Number of children: 4  . Years of education: 5  . Highest education level: Not on file  Occupational History  . Not on file  Social Needs  . Financial resource strain: Not very hard  . Food insecurity:    Worry: Never true    Inability: Never true  . Transportation needs:    Medical: No    Non-medical: No  Tobacco Use  . Smoking status: Never Smoker  . Smokeless tobacco: Never Used  Substance and Sexual Activity  . Alcohol use: No  . Drug use: No  . Sexual activity: Yes    Birth control/protection: Implant    Comment: last intercourse Jul 09 2015  Lifestyle  . Physical activity:    Days per week: 0 days    Minutes per session: 0 min  . Stress: Only a little  Relationships  . Social connections:    Talks on phone: More than three times a week    Gets together: More than three times a week    Attends religious service: More than 4 times per  year    Active member of club or organization: No    Attends meetings of clubs or organizations: Never    Relationship status: Married  . Intimate partner violence:    Fear of current or ex partner: No    Emotionally abused: No    Physically abused: No    Forced sexual activity: No  Other Topics Concern  . Not on file  Social History Narrative   Lives at home with husband, 3 youngest children   60 yo daughter still lives in [place].     Review of Systems     Objective:   Physical Exam NAD HEENT:  PERRL, EOMI, TMs pearly gray, throat without injection. Neck:  Supple, No adenopathy.  Thyroid is generous.  No thyroid mass Chest:  CTA CV:  RRR with normal S1 and S2, No S3, S4 or rub.  Grade I/VI  SEM upper LSB to right 2nd interspace and very faintly into carotids.  More pronounced leaning forward. No carotid bruits.  Carotid, radial and DP Pulses normal and equal Abd:  S, NT, No HSM or mass, + BS MS:  Tender across  entire trap on right and up along cervical paraspinous musculature to nuchal ridge.   Neuro:  A & O X3, CN  II-XII grossly intact.  Motor 5/5 throughout, DTRs 2+/4 throughout, sensory to light touch normal.  Gait normal.   Feet:  No lesions.        Assessment & Plan:  1.  Right neck/trap muscle spasm and pain:  Ibuprofen 400-600 mg twice daily for 2 weeks. Warm pack and stretches nightly. Stop Gabapentin. PT referral. Stop using shoulder bag and holding phone with shoulder. Good posture.  2.  Foot cramps:  CBC, CMP, Vitamin B complex twice daily.  [place] soap in between sheets in bed.  3.  Decreased visual acuity:  Optometry referral.  Eyeglass voucher.  4.  Mild thyromegaly:  TSH

## 2018-09-30 LAB — COMPREHENSIVE METABOLIC PANEL
ALBUMIN: 4.6 g/dL (ref 3.5–5.5)
ALT: 16 IU/L (ref 0–32)
AST: 16 IU/L (ref 0–40)
Albumin/Globulin Ratio: 1.5 (ref 1.2–2.2)
Alkaline Phosphatase: 98 IU/L (ref 39–117)
BILIRUBIN TOTAL: 0.5 mg/dL (ref 0.0–1.2)
BUN/Creatinine Ratio: 17 (ref 9–23)
BUN: 12 mg/dL (ref 6–24)
CHLORIDE: 105 mmol/L (ref 96–106)
CO2: 18 mmol/L — ABNORMAL LOW (ref 20–29)
Calcium: 11.7 mg/dL — ABNORMAL HIGH (ref 8.7–10.2)
Creatinine, Ser: 0.71 mg/dL (ref 0.57–1.00)
GFR calc non Af Amer: 105 mL/min/{1.73_m2} (ref >59)
GFR, EST AFRICAN AMERICAN: 121 mL/min/{1.73_m2} (ref >59)
Globulin, Total: 3 g/dL (ref 1.5–4.5)
Glucose: 87 mg/dL (ref 65–99)
POTASSIUM: 4.5 mmol/L (ref 3.5–5.2)
Sodium: 139 mmol/L (ref 134–144)
TOTAL PROTEIN: 7.6 g/dL (ref 6.0–8.5)

## 2018-09-30 LAB — TSH: TSH: 1.27 u[IU]/mL (ref 0.450–4.500)

## 2018-09-30 LAB — CBC WITH DIFFERENTIAL/PLATELET
BASOS: 1 %
Basophils Absolute: 0 10*3/uL (ref 0.0–0.2)
EOS (ABSOLUTE): 0.1 10*3/uL (ref 0.0–0.4)
EOS: 1 %
HEMATOCRIT: 40.4 % (ref 34.0–46.6)
HEMOGLOBIN: 13.8 g/dL (ref 11.1–15.9)
IMMATURE GRANS (ABS): 0 10*3/uL (ref 0.0–0.1)
Immature Granulocytes: 0 %
LYMPHS: 46 %
Lymphocytes Absolute: 3.1 10*3/uL (ref 0.7–3.1)
MCH: 29.8 pg (ref 26.6–33.0)
MCHC: 34.2 g/dL (ref 31.5–35.7)
MCV: 87 fL (ref 79–97)
MONOCYTES: 7 %
Monocytes Absolute: 0.5 10*3/uL (ref 0.1–0.9)
NEUTROS ABS: 3 10*3/uL (ref 1.4–7.0)
Neutrophils: 45 %
Platelets: 252 10*3/uL (ref 150–450)
RBC: 4.63 x10E6/uL (ref 3.77–5.28)
RDW: 12.2 % — ABNORMAL LOW (ref 12.3–15.4)
WBC: 6.7 10*3/uL (ref 3.4–10.8)

## 2018-11-25 ENCOUNTER — Other Ambulatory Visit: Payer: Self-pay

## 2018-11-26 LAB — CALCIUM, IONIZED: CALCIUM ION: 6.2 mg/dL — AB (ref 4.5–5.6)

## 2018-12-10 ENCOUNTER — Other Ambulatory Visit: Payer: Self-pay | Admitting: Internal Medicine

## 2018-12-10 DIAGNOSIS — Z1231 Encounter for screening mammogram for malignant neoplasm of breast: Secondary | ICD-10-CM

## 2018-12-28 ENCOUNTER — Ambulatory Visit: Payer: Self-pay | Admitting: Internal Medicine

## 2019-01-03 ENCOUNTER — Ambulatory Visit: Payer: Self-pay | Admitting: Internal Medicine

## 2019-01-03 ENCOUNTER — Encounter: Payer: Self-pay | Admitting: Internal Medicine

## 2019-01-03 DIAGNOSIS — H547 Unspecified visual loss: Secondary | ICD-10-CM

## 2019-01-03 DIAGNOSIS — M542 Cervicalgia: Secondary | ICD-10-CM

## 2019-01-03 DIAGNOSIS — R252 Cramp and spasm: Secondary | ICD-10-CM

## 2019-01-03 DIAGNOSIS — K219 Gastro-esophageal reflux disease without esophagitis: Secondary | ICD-10-CM

## 2019-01-03 MED ORDER — FAMOTIDINE 20 MG PO TABS: ORAL_TABLET | ORAL | 4 refills | Status: DC

## 2019-01-03 NOTE — Patient Instructions (Signed)
Gastroesophageal Reflux Disease, Adult Gastroesophageal reflux (GER) happens when acid from the stomach flows up into the tube that connects the mouth and the stomach (esophagus). Normally, food travels down the esophagus and stays in the stomach to be digested. However, when a person has GER, food and stomach acid sometimes move back up into the esophagus. If this becomes a more serious problem, the person may be diagnosed with a disease called gastroesophageal reflux disease (GERD). GERD occurs when the reflux:  Happens often.  Causes frequent or severe symptoms.  Causes problems such as damage to the esophagus. When stomach acid comes in contact with the esophagus, the acid may cause soreness (inflammation) in the esophagus. Over time, GERD may create small holes (ulcers) in the lining of the esophagus. What are the causes? This condition is caused by a problem with the muscle between the esophagus and the stomach (lower esophageal sphincter, or LES). Normally, the LES muscle closes after food passes through the esophagus to the stomach. When the LES is weakened or abnormal, it does not close properly, and that allows food and stomach acid to go back up into the esophagus. The LES can be weakened by certain dietary substances, medicines, and medical conditions, including:  Tobacco use.  Pregnancy.  Having a hiatal hernia.  Alcohol use.  Certain foods and beverages, such as coffee, chocolate, onions, and peppermint. What increases the risk? You are more likely to develop this condition if you:  Have an increased body weight.  Have a connective tissue disorder.  Use NSAID medicines. What are the signs or symptoms? Symptoms of this condition include:  Heartburn.  Difficult or painful swallowing.  The feeling of having a lump in the throat.  Abitter taste in the mouth.  Bad breath.  Having a large amount of saliva.  Having an upset or bloated  stomach.  Belching.  Chest pain. Different conditions can cause chest pain. Make sure you see your health care provider if you experience chest pain.  Shortness of breath or wheezing.  Ongoing (chronic) cough or a night-time cough.  Wearing away of tooth enamel.  Weight loss. How is this diagnosed? Your health care provider will take a medical history and perform a physical exam. To determine if you have mild or severe GERD, your health care provider may also monitor how you respond to treatment. You may also have tests, including:  A test to examine your stomach and esophagus with a small camera (endoscopy).  A test thatmeasures the acidity level in your esophagus.  A test thatmeasures how much pressure is on your esophagus.  A barium swallow or modified barium swallow test to show the shape, size, and functioning of your esophagus. How is this treated? The goal of treatment is to help relieve your symptoms and to prevent complications. Treatment for this condition may vary depending on how severe your symptoms are. Your health care provider may recommend:  Changes to your diet.  Medicine.  Surgery. Follow these instructions at home: Eating and drinking   Follow a diet as recommended by your health care provider. This may involve avoiding foods and drinks such as: ? Coffee and tea (with or without caffeine). ? Drinks that containalcohol. ? Energy drinks and sports drinks. ? Carbonated drinks or sodas. ? Chocolate and cocoa. ? Peppermint and mint flavorings. ? Garlic and onions. ? Horseradish. ? Spicy and acidic foods, including peppers, chili powder, curry powder, vinegar, hot sauces, and barbecue sauce. ? Citrus fruit juices and citrus   fruits, such as oranges, lemons, and limes. ? Tomato-based foods, such as red sauce, chili, salsa, and pizza with red sauce. ? Fried and fatty foods, such as donuts, french fries, potato chips, and high-fat dressings. ? High-fat  meats, such as hot dogs and fatty cuts of red and white meats, such as rib eye steak, sausage, ham, and bacon. ? High-fat dairy items, such as whole milk, butter, and cream cheese.  Eat small, frequent meals instead of large meals.  Avoid drinking large amounts of liquid with your meals.  Avoid eating meals during the 2-3 hours before bedtime.  Avoid lying down right after you eat.  Do not exercise right after you eat. Lifestyle   Do not use any products that contain nicotine or tobacco, such as cigarettes, e-cigarettes, and chewing tobacco. If you need help quitting, ask your health care provider.  Try to reduce your stress by using methods such as yoga or meditation. If you need help reducing stress, ask your health care provider.  If you are overweight, reduce your weight to an amount that is healthy for you. Ask your health care provider for guidance about a safe weight loss goal. General instructions  Pay attention to any changes in your symptoms.  Take over-the-counter and prescription medicines only as told by your health care provider. Do not take aspirin, ibuprofen, or other NSAIDs unless your health care provider told you to do so.  Wear loose-fitting clothing. Do not wear anything tight around your waist that causes pressure on your abdomen.  Raise (elevate) the head of your bed about 6 inches (15 cm).  Avoid bending over if this makes your symptoms worse.  Keep all follow-up visits as told by your health care provider. This is important. Contact a health care provider if:  You have: ? New symptoms. ? Unexplained weight loss. ? Difficulty swallowing or it hurts to swallow. ? Wheezing or a persistent cough. ? A hoarse voice.  Your symptoms do not improve with treatment. Get help right away if you:  Have pain in your arms, neck, jaw, teeth, or back.  Feel sweaty, dizzy, or light-headed.  Have chest pain or shortness of breath.  Vomit and your vomit looks  like blood or coffee grounds.  Faint.  Have stool that is bloody or black.  Cannot swallow, drink, or eat. Summary  Gastroesophageal reflux happens when acid from the stomach flows up into the esophagus. GERD is a disease in which the reflux happens often, causes frequent or severe symptoms, or causes problems such as damage to the esophagus.  Treatment for this condition may vary depending on how severe your symptoms are. Your health care provider may recommend diet and lifestyle changes, medicine, or surgery.  Contact a health care provider if you have new or worsening symptoms.  Take over-the-counter and prescription medicines only as told by your health care provider. Do not take aspirin, ibuprofen, or other NSAIDs unless your health care provider told you to do so.  Keep all follow-up visits as told by your health care provider. This is important. This information is not intended to replace advice given to you by your health care provider. Make sure you discuss any questions you have with your health care provider. Document Released: 07/23/2005 Document Revised: 04/21/2018 Document Reviewed: 04/21/2018 Elsevier Interactive Patient Education  2019 Elsevier Inc.  

## 2019-01-03 NOTE — Progress Notes (Signed)
    Subjective:    Patient ID: Casey Mayo, female   DOB: [birth year], 43 y.o.   MRN: [id number]   HPI   1.  Elevated calcium with high ionized calcium.  She has not yet had PTH drawn.  Checking on cost.   Patient states she was told 5-7 years ago that her Vitamin D level was low.   She states she has been taking Vitamin D for some time, stopping a couple of months ago.   Taking Vitamin D3.  She is not sure of the dosage.  Took once daily when she was taking.   Was taking a multivitamin also with calcium until about 2 months ago as well. Her calcium was 11.7 on December 4. 2019.  She has a level of 11.2 in 2018 as well.  Ionized calcium returned high end of January at 6.2 She does not drink much milk.  No other type of milk.   Denies using something such a  Tums for GERD.  Taking Omeprazole, however.  No history of kidney stones.  No pain on urination.   Denies any bony pain and her back no longer bothers her.  Not having foot cramps currently.  She is not taking the Vitamin B currently.  No history of smoking.  No cough or dyspnea.    2.  Right upper back pain Using ibuprofen only occasionally now.  She never was able to get an appt with [facility].  3.  Decreased visual acuity:  Did get optometry appt and voucher for eyeglasses, which she has obtained.     4. Heartburn: gets symptoms if eats late and goes to bed.  Drinks coffee in the morning and does eat onions.  Has been using Omeprazole 20 mg daily as needed for her symptoms.    Current Meds  Medication Sig  . acetaminophen (TYLENOL) 325 MG tablet Take 650 mg by mouth every 6 (six) hours as needed for mild pain. Reported on 04/21/2016  . omeprazole (PRILOSEC) 20 MG capsule Take 1 capsule (20 mg total) by mouth daily.  . Vitamins-Lipotropics (B-100 COMPLEX) TABS 1 tab by mouth twice daily.   No Known Allergies   Review of Systems    Objective:   BP 130/82 (BP Location: Left Arm, Patient Position: Sitting, Cuff  Size: Large)   Pulse 66   Resp 12   Ht 5\' 11"  (1.803 m)   Wt 212 lb (96.2 kg)   LMP 12/20/2018 (Approximate)   BMI 29.57 kg/m   Physical Exam  NAD Neck:  Supple, No thyromegaly noted today Chest:  CTA CV:  RRR with Grade I-II/VI SEM unchanged.   Radial pulses normal and equal. Abd:  S, NT, + BS   Assessment & Plan   1.  Right upper back pain:  No longer a concern.  Check on why never obtained PT referral.  2.  Foot cramps:  Rare now.  3.  Hypercalcemia:  Cost for PTH is about $135.  Today, describes taking Vitamin D3 and Calcium up until her visit beginning of December.   Recheck calcium today and draw purple top for PTH in case her calcium remains high. No definite symptoms of elevated calcium.  4.  Mild GERD:  Famotidine 40 mg at bedtime as needed.  Reflux precautions.

## 2019-01-04 LAB — BASIC METABOLIC PANEL
BUN/Creatinine Ratio: 15 (ref 9–23)
BUN: 9 mg/dL (ref 6–24)
CO2: 21 mmol/L (ref 20–29)
CREATININE: 0.59 mg/dL (ref 0.57–1.00)
Calcium: 10.9 mg/dL — ABNORMAL HIGH (ref 8.7–10.2)
Chloride: 106 mmol/L (ref 96–106)
GFR, EST AFRICAN AMERICAN: 131 mL/min/{1.73_m2} (ref >59)
GFR, EST NON AFRICAN AMERICAN: 113 mL/min/{1.73_m2} (ref >59)
Glucose: 98 mg/dL (ref 65–99)
POTASSIUM: 4.2 mmol/L (ref 3.5–5.2)
Sodium: 140 mmol/L (ref 134–144)

## 2019-04-04 ENCOUNTER — Other Ambulatory Visit: Payer: Self-pay

## 2019-04-04 ENCOUNTER — Ambulatory Visit: Payer: Self-pay | Admitting: Internal Medicine

## 2019-04-04 ENCOUNTER — Encounter: Payer: Self-pay | Admitting: Internal Medicine

## 2019-04-04 VITALS — BP 124/78 | HR 70 | Resp 12 | Ht 71.0 in | Wt 217.0 lb

## 2019-04-04 DIAGNOSIS — R252 Cramp and spasm: Secondary | ICD-10-CM

## 2019-04-04 DIAGNOSIS — Z1239 Encounter for other screening for malignant neoplasm of breast: Secondary | ICD-10-CM

## 2019-04-04 DIAGNOSIS — Z13220 Encounter for screening for lipoid disorders: Secondary | ICD-10-CM

## 2019-04-04 DIAGNOSIS — Z0000 Encounter for general adult medical examination without abnormal findings: Secondary | ICD-10-CM

## 2019-04-04 DIAGNOSIS — K029 Dental caries, unspecified: Secondary | ICD-10-CM

## 2019-04-04 NOTE — Progress Notes (Signed)
Subjective:    Patient ID: Casey Mayo, female   DOB: November 17, 1975, 43 y.o.   MRN: [id number]   HPI   CPE without pap  1.  Pap:  Last within 3 years.  Always normal.  Has Nexplanon to be removed in July at [facility].  No family history of cervical cancer.    2.  Mammogram:  One in past:  1.2019.  Normal.  No family history of breast cancer.   Canceled her mammogram last month due to pandemic and transportation. She will set back up in next 6 months.  3.  Osteoprevention:  No frequent intake of dairy.  No calcium or Vitamin D supplements.  Does eat sardines.  She was taking calcium and Vitamin D, but was asked to stop at last visit as her calcium was high.  PTH is costly to check.   Walks every day for 15-20.   No family history of osteoporosis.  4.  Guaiac Cards:  Never.  5.  Colonoscopy:  Never.  No family history of colon cancer.    6.  Immunizations:  Had a flu vaccine after her child was born maybe 2-3 years ago. Immunization History  Administered Date(s) Administered  . Tdap 04/23/2016     7.  Glucose/Cholesterol:  Has not had cholesterol checked since 2011.  Was fine then, though HDL borderline low. Glucose has been normal  in December 2019  Current Meds  Medication Sig  . acetaminophen (TYLENOL) 325 MG tablet Take 650 mg by mouth every 6 (six) hours as needed for mild pain. Reported on 04/21/2016  . ibuprofen (ADVIL,MOTRIN) 200 MG tablet 2-3 tabs by mouth twice daily.   No Known Allergies   Past Medical History:  Diagnosis Date  . Medical history non-contributory   . Pregnancy induced hypertension 2017   previous 3 pregnancy without bp problems     History reviewed. No pertinent surgical history.  History reviewed. No pertinent family history.  Social History   Socioeconomic History  . Marital status: Married    Spouse name: Ibra Kehoe  . Number of children: 4  . Years of education: 5  . Highest education level: Not on file  Occupational History  .  Occupation: Hair dresser  Social Needs  . Financial resource strain: Not very hard  . Food insecurity:    Worry: Never true    Inability: Never true  . Transportation needs:    Medical: No    Non-medical: No  Tobacco Use  . Smoking status: Never Smoker  . Smokeless tobacco: Never Used  Substance and Sexual Activity  . Alcohol use: No  . Drug use: No  . Sexual activity: Yes    Birth control/protection: Implant    Comment: last intercourse Jul 09 2015  Lifestyle  . Physical activity:    Days per week: 7 days    Minutes per session: 20 min  . Stress: Only a little  Relationships  . Social connections:    Talks on phone: More than three times a week    Gets together: More than three times a week    Attends religious service: More than 4 times per year    Active member of club or organization: No    Attends meetings of clubs or organizations: Never    Relationship status: Married  . Intimate partner violence:    Fear of current or ex partner: No    Emotionally abused: No    Physically abused: No  Forced sexual activity: No  Other Topics Concern  . Not on file  Social History Narrative   Lives at home with husband, 3 youngest children   49 yo daughter still lives in [place].      Review of Systems  Constitutional: Negative for appetite change, fatigue and fever.  HENT: Positive for dental problem (pain with cold  or need to chew--hurts as gets stuck in cavities.). Negative for ear pain, hearing loss, rhinorrhea, sinus pressure, sneezing and sore throat.   Eyes: Positive for visual disturbance (Reading glasses work for her.).  Respiratory: Negative for cough and shortness of breath.   Cardiovascular: Negative for chest pain, palpitations and leg swelling.  Gastrointestinal: Positive for abdominal pain (Low abdomen, cramping in nature like with a period.  Every 4-5 days.  Has the cramping for about 5 minutes and then gone.  Her periods are regular.  Last May 25th.   Periods last 5 days and cramping then is the same.  She does not have blood flow outside of) and constipation (Chronic.  Drinks OJ.  Does not drink water well.  Does nothing to prevent currently.  Has history of elevated calcium .  Eats a salad every 3 days and next day will have stool.  ). Negative for blood in stool (No melena) and diarrhea.  Genitourinary: Positive for frequency (even at night.  Bottle of water before bedtime.). Negative for dysuria and vaginal discharge.  Musculoskeletal: Negative for arthralgias.       Neck pain is better after her virtual visit to get home exercise program.  Skin: Negative for rash.  Neurological: Negative for weakness and numbness.  Psychiatric/Behavioral: Negative for dysphoric mood. The patient is not nervous/anxious.       Objective:   BP 124/78 (BP Location: Left Arm, Patient Position: Sitting, Cuff Size: Large)   Pulse 70   Resp 12   Ht 5\' 11"  (1.803 m)   Wt 217 lb (98.4 kg)   LMP 03/21/2019   BMI 30.27 kg/m   Physical Exam  Constitutional: She is oriented to person, place, and time. She appears well-developed and well-nourished.  HENT:  Head: Normocephalic and atraumatic.  Right Ear: Hearing, tympanic membrane, external ear and ear canal normal.  Left Ear: Hearing, tympanic membrane, external ear and ear canal normal.  Nose: Nose normal.  Mouth/Throat: Uvula is midline, oropharynx is clear and moist and mucous membranes are normal. Dental caries present.  Eyes: Pupils are equal, round, and reactive to light. Conjunctivae and EOM are normal.  Discs sharp bilaterally.  Neck: Normal range of motion, full passive range of motion without pain and phonation normal. Neck supple. No thyroid mass and no thyromegaly present.  Cardiovascular: Normal rate, regular rhythm, S1 normal and S2 normal. Exam reveals no S3, no S4 and no friction rub.  Grade I-II SEM heard best at LUSB, radiating very faintly to right 2nd interspace.  Unable to hear in  carotids today. No carotid bruits.  Carotid, Radial, femoral, DP and PT  Pulses normal and equal. No LE edema.  Pulmonary/Chest: Effort normal and breath sounds normal. Right breast exhibits no inverted nipple, no mass, no nipple discharge, no skin change and no tenderness. Left breast exhibits no inverted nipple, no mass, no nipple discharge, no skin change and no tenderness.  Abdominal: Soft. Bowel sounds are normal. She exhibits no mass. There is no hepatosplenomegaly. There is no abdominal tenderness. No hernia.  Genitourinary:    Genitourinary Comments: Has pelvic exam and possibly pap  scheduled with [facility] later this month.  To have Nexplanon removed.   Musculoskeletal: Normal range of motion.  Lymphadenopathy:       Head (right side): No submental and no submandibular adenopathy present.       Head (left side): No submental and no submandibular adenopathy present.    She has no cervical adenopathy.    She has no axillary adenopathy.       Right: No inguinal and no supraclavicular adenopathy present.       Left: No inguinal and no supraclavicular adenopathy present.  Neurological: She is alert and oriented to person, place, and time. She has normal strength and normal reflexes. No cranial nerve deficit or sensory deficit. Coordination and gait normal.  Skin: Skin is warm. No lesion and no rash noted.  Psychiatric: She has a normal mood and affect. Her speech is normal and behavior is normal. Thought content normal. Cognition and memory are normal.     Assessment & Plan  1.  CPE without pelvic or pap--to obtain at [place] cards x 3 to return in 2 weeks. Mammogram when pandemic less of a concern and can take public transportation.  2.  Constipation :  To improve diet and water intake throughout the day.  3.  Neck Pain :  Resolved  4.  Foot cramps:  Improve hydration and diet; stretches  5.  Hypercalcemia:  Could be adding to problems 2 and 4.  Recheck today after stopping  supplementation for several months (recheck delayed due to pandemic)

## 2019-04-04 NOTE — Patient Instructions (Addendum)
Can google "advance directives, [place]"  And bring up form from Secretary of [place]. Print and fill out Or can go to "5 wishes"  Which is also in Spanish and fill out--this costs $5--perhaps easier to use. Designate a Forensic scientist to speak for you if you are unable to speak for yourself when ill or injured  Do foot stretches before standing after prolonged sitting/lying  Drink a glass of water before every meal Drink 6-8 glasses of water daily Eat three meals daily Eat a protein and healthy fat with every meal (eggs,fish, chicken, [place] and limit red meats) Eat 5 servings of vegetables daily, mix the colors Eat 2 servings of fruit daily with skin, if skin is edible Use smaller plates Put food/utensils down as you chew and swallow each bite Eat at a table with friends/family at least once daily, no TV Do not eat in front of the TV  Recent studies show that people who consume all of their calories in a 12 hour period lose weight more efficiently.  For example, if you eat your first meal at 7:00 a.m., your last meal of the day should be completed by 7:00 p.m.

## 2019-04-05 LAB — COMPREHENSIVE METABOLIC PANEL
ALT: 17 IU/L (ref 0–32)
AST: 18 IU/L (ref 0–40)
Albumin/Globulin Ratio: 1.7 (ref 1.2–2.2)
Albumin: 4.3 g/dL (ref 3.8–4.8)
Alkaline Phosphatase: 94 IU/L (ref 39–117)
BUN/Creatinine Ratio: 14 (ref 9–23)
BUN: 9 mg/dL (ref 6–24)
Bilirubin Total: 0.5 mg/dL (ref 0.0–1.2)
CO2: 22 mmol/L (ref 20–29)
Calcium: 11 mg/dL — ABNORMAL HIGH (ref 8.7–10.2)
Chloride: 106 mmol/L (ref 96–106)
Creatinine, Ser: 0.65 mg/dL (ref 0.57–1.00)
GFR calc Af Amer: 127 mL/min/{1.73_m2} (ref >59)
GFR calc non Af Amer: 110 mL/min/{1.73_m2} (ref >59)
Globulin, Total: 2.5 g/dL (ref 1.5–4.5)
Glucose: 95 mg/dL (ref 65–99)
Potassium: 4.5 mmol/L (ref 3.5–5.2)
Sodium: 140 mmol/L (ref 134–144)
Total Protein: 6.8 g/dL (ref 6.0–8.5)

## 2019-04-05 LAB — LIPID PANEL W/O CHOL/HDL RATIO
Cholesterol, Total: 143 mg/dL (ref 100–199)
HDL: 48 mg/dL (ref >39)
LDL Calculated: 87 mg/dL (ref 0–99)
Triglycerides: 40 mg/dL (ref 0–149)
VLDL Cholesterol Cal: 8 mg/dL (ref 5–40)

## 2019-04-05 LAB — CBC WITH DIFFERENTIAL/PLATELET
Basophils Absolute: 0 10*3/uL (ref 0.0–0.2)
Basos: 1 %
EOS (ABSOLUTE): 0.1 10*3/uL (ref 0.0–0.4)
Eos: 2 %
Hematocrit: 41.8 % (ref 34.0–46.6)
Hemoglobin: 13.7 g/dL (ref 11.1–15.9)
Immature Grans (Abs): 0 10*3/uL (ref 0.0–0.1)
Immature Granulocytes: 0 %
Lymphocytes Absolute: 2.6 10*3/uL (ref 0.7–3.1)
Lymphs: 47 %
MCH: 29.5 pg (ref 26.6–33.0)
MCHC: 32.8 g/dL (ref 31.5–35.7)
MCV: 90 fL (ref 79–97)
Monocytes Absolute: 0.4 10*3/uL (ref 0.1–0.9)
Monocytes: 7 %
Neutrophils Absolute: 2.3 10*3/uL (ref 1.4–7.0)
Neutrophils: 43 %
Platelets: 256 10*3/uL (ref 150–450)
RBC: 4.64 x10E6/uL (ref 3.77–5.28)
RDW: 12.2 % (ref 11.7–15.4)
WBC: 5.4 10*3/uL (ref 3.4–10.8)

## 2019-04-27 ENCOUNTER — Other Ambulatory Visit (INDEPENDENT_AMBULATORY_CARE_PROVIDER_SITE_OTHER): Payer: Self-pay

## 2019-04-27 DIAGNOSIS — Z1211 Encounter for screening for malignant neoplasm of colon: Secondary | ICD-10-CM

## 2019-04-27 DIAGNOSIS — E213 Hyperparathyroidism, unspecified: Secondary | ICD-10-CM

## 2019-04-27 HISTORY — DX: Hyperparathyroidism, unspecified: E21.3

## 2019-04-27 LAB — POC HEMOCCULT BLD/STL (HOME/3-CARD/SCREEN)
Card #2 Fecal Occult Blod, POC: NEGATIVE
Card #3 Fecal Occult Blood, POC: NEGATIVE
Fecal Occult Blood, POC: NEGATIVE

## 2019-05-10 ENCOUNTER — Other Ambulatory Visit: Payer: Self-pay

## 2019-05-10 DIAGNOSIS — E213 Hyperparathyroidism, unspecified: Secondary | ICD-10-CM

## 2019-05-11 LAB — PARATHYROID HORMONE, INTACT (NO CA): PTH: 72 pg/mL — ABNORMAL HIGH (ref 15–65)

## 2019-05-16 NOTE — Addendum Note (Signed)
Addended by: Marcelino Duster on: 05/16/2019 10:39 AM   Modules accepted: Orders

## 2020-04-03 ENCOUNTER — Encounter: Payer: Self-pay | Admitting: Internal Medicine

## 2020-05-21 ENCOUNTER — Other Ambulatory Visit: Payer: Self-pay

## 2020-05-21 DIAGNOSIS — R350 Frequency of micturition: Secondary | ICD-10-CM

## 2020-05-21 LAB — POCT URINALYSIS DIPSTICK
Bilirubin, UA: NEGATIVE
Glucose, UA: NEGATIVE
Ketones, UA: NEGATIVE
Leukocytes, UA: NEGATIVE
Nitrite, UA: NEGATIVE
Protein, UA: NEGATIVE
Spec Grav, UA: 1.01 (ref 1.010–1.025)
Urobilinogen, UA: 0.2 E.U./dL
pH, UA: 8 (ref 5.0–8.0)

## 2020-05-21 LAB — GLUCOSE, POCT (MANUAL RESULT ENTRY): POC Glucose: 120 mg/dl — AB (ref 70–99)

## 2020-05-22 LAB — COMPREHENSIVE METABOLIC PANEL
ALT: 19 IU/L (ref 0–32)
AST: 19 IU/L (ref 0–40)
Albumin/Globulin Ratio: 1.9 (ref 1.2–2.2)
Albumin: 4.3 g/dL (ref 3.8–4.8)
Alkaline Phosphatase: 85 IU/L (ref 48–121)
BUN/Creatinine Ratio: 13 (ref 9–23)
BUN: 9 mg/dL (ref 6–24)
Bilirubin Total: 0.6 mg/dL (ref 0.0–1.2)
CO2: 20 mmol/L (ref 20–29)
Calcium: 11.1 mg/dL — ABNORMAL HIGH (ref 8.7–10.2)
Chloride: 104 mmol/L (ref 96–106)
Creatinine, Ser: 0.71 mg/dL (ref 0.57–1.00)
GFR calc Af Amer: 121 mL/min/{1.73_m2} (ref >59)
GFR calc non Af Amer: 105 mL/min/{1.73_m2} (ref >59)
Globulin, Total: 2.3 g/dL (ref 1.5–4.5)
Glucose: 101 mg/dL — ABNORMAL HIGH (ref 65–99)
Potassium: 4.1 mmol/L (ref 3.5–5.2)
Sodium: 137 mmol/L (ref 134–144)
Total Protein: 6.6 g/dL (ref 6.0–8.5)

## 2020-05-23 LAB — URINE CULTURE

## 2020-05-24 NOTE — Progress Notes (Signed)
Patient came to pick up orange card. Having urinary frequency. Left UA and culture.

## 2020-05-29 ENCOUNTER — Encounter: Payer: Self-pay | Admitting: Internal Medicine

## 2020-06-01 ENCOUNTER — Other Ambulatory Visit: Payer: Self-pay

## 2020-06-01 DIAGNOSIS — R739 Hyperglycemia, unspecified: Secondary | ICD-10-CM

## 2020-06-02 LAB — HGB A1C W/O EAG: Hgb A1c MFr Bld: 5.7 % — ABNORMAL HIGH (ref 4.8–5.6)

## 2020-07-20 ENCOUNTER — Encounter: Payer: Self-pay | Admitting: Internal Medicine

## 2020-08-21 ENCOUNTER — Encounter: Payer: Self-pay | Admitting: Internal Medicine

## 2020-10-03 ENCOUNTER — Encounter: Payer: Self-pay | Admitting: Internal Medicine

## 2020-10-03 ENCOUNTER — Other Ambulatory Visit: Payer: Self-pay

## 2020-10-03 ENCOUNTER — Ambulatory Visit: Payer: Self-pay | Admitting: Internal Medicine

## 2020-10-03 VITALS — BP 134/65 | HR 63 | Resp 12 | Ht 69.5 in | Wt 221.0 lb

## 2020-10-03 DIAGNOSIS — Z9189 Other specified personal risk factors, not elsewhere classified: Secondary | ICD-10-CM

## 2020-10-03 DIAGNOSIS — E213 Hyperparathyroidism, unspecified: Secondary | ICD-10-CM

## 2020-10-03 DIAGNOSIS — G8929 Other chronic pain: Secondary | ICD-10-CM

## 2020-10-03 DIAGNOSIS — Z0000 Encounter for general adult medical examination without abnormal findings: Secondary | ICD-10-CM

## 2020-10-03 DIAGNOSIS — E669 Obesity, unspecified: Secondary | ICD-10-CM

## 2020-10-03 DIAGNOSIS — M25572 Pain in left ankle and joints of left foot: Secondary | ICD-10-CM

## 2020-10-03 DIAGNOSIS — Z6832 Body mass index (BMI) 32.0-32.9, adult: Secondary | ICD-10-CM

## 2020-10-03 DIAGNOSIS — M5442 Lumbago with sciatica, left side: Secondary | ICD-10-CM

## 2020-10-03 MED ORDER — FAMOTIDINE 20 MG PO TABS: 20.0000 mg | ORAL_TABLET | Freq: Two times a day (BID) | ORAL | 6 refills | Status: DC

## 2020-10-03 NOTE — Progress Notes (Signed)
Subjective:    Patient ID: Casey Mayo, female   DOB: 01-10-1976, 44 y.o.   MRN: [id number]   HPI   CPE without pap  1.  Pap:  Normal pap smear 06/2020 with PHD.    2.  Mammogram:  Last 10/2017.  Tried to call to schedule, but could not connect.  No family history of breast cancer.  Has delivered 4 children.  All were breast fed for 1 year or more.  She would prefer to wait until we discuss next year.   3.  Osteoprevention:  Willing to drink 3-4 cups of almond or soy milk daily.  Walks sometimes, but back and leg hurt after a fall she had earlier in year.   4.  Guaiac Cards:  Last performed 04/2019 and negative for blood.  5.  Colonoscopy:  Never.  No family history of colon cancer.    6.  Immunizations:  Refuses influenza vaccine.  Has not had COVID booster yet. Immunization History  Administered Date(s) Administered  . PFIZER SARS-COV-2 Vaccination 01/14/2020, 02/04/2020  . Tdap 04/23/2016     7.  Glucose/Cholesterol:  History of prediabetes with A1C of 5.7% and cholesterol has been fine in past  8.  Hyperparathyroidism:  No history of generalized bony ache or renal lithiasis.  Did not understand what general surgery was calling about earlier in year and hung up.  Never was reappointed.  Likely as missed appt.  Current Meds  Medication Sig  . acetaminophen (TYLENOL) 325 MG tablet Take 650 mg by mouth every 6 (six) hours as needed for mild pain. Reported on 04/21/2016  . famotidine (PEPCID) 20 MG tablet Take 20 mg by mouth 2 (two) times daily.  Marland Kitchen ibuprofen (ADVIL,MOTRIN) 200 MG tablet 2-3 tabs by mouth twice daily.  Marland Kitchen levonorgestrel (MIRENA) 20 MCG/24HR IUD 1 each by Intrauterine route once. Placed 06/2020 by PHD:  5 year   No Known Allergies   Past Medical History:  Diagnosis Date  . Hyperparathyroidism ([place]) 04/2019  . Pregnancy induced hypertension 2017   previous 3 pregnancy without bp problems    History reviewed. No pertinent surgical history.  Family History   Problem Relation Age of Onset  . Heart disease Mother        Possibly CHF   Social History   Socioeconomic History  . Marital status: Married    Spouse name: Ibra Prestwood  . Number of children: 4  . Years of education: 5  . Highest education level: Not on file  Occupational History  . Occupation: Hair dresser  Tobacco Use  . Smoking status: Never Smoker  . Smokeless tobacco: Never Used  Vaping Use  . Vaping Use: Never used  Substance and Sexual Activity  . Alcohol use: No  . Drug use: No  . Sexual activity: Yes    Birth control/protection: I.U.D.    Comment: last intercourse Jul 09 2015  Other Topics Concern  . Not on file  Social History Narrative   Lives at home with husband, 3 youngest children   65 yo daughter still lives in [place].   Social Determinants of Health   Financial Resource Strain: [place]   . Difficulty of Paying Living Expenses: Not hard at all  Food Insecurity: No Food Insecurity  . Worried About Charity fundraiser in the Last Year: Never true  . Ran Out of Food in the Last Year: Never true  Transportation Needs: No Transportation Needs  . Lack of Transportation (Medical): No  .  Lack of Transportation (Non-Medical): No  Physical Activity: Not on file  Stress: Not on file  Social Connections: Not on file  Intimate Partner Violence: Not At Risk  . Fear of Current or Ex-Partner: No  . Emotionally Abused: No  . Physically Abused: No  . Sexually Abused: No      Review of Systems  Musculoskeletal:       [place] September 5th.  Slipped on water in grass, twisted left ankle and fell forward onto grass. Her ankle continues to swell and hurt.   Her low back also hurts on and off with left radiculopathy.  Has not been seen for this anywhere.      Objective:   BP 134/65 (BP Location: Right Arm, Patient Position: Sitting, Cuff Size: Normal)   Pulse 63   Resp 12   Ht 5' 9.5" (1.765 m)   Wt 221 lb (100.2 kg)   LMP 08/27/2020 (Approximate)  Comment: IUD placed 06/2020 and periods irregular.    BMI 32.17 kg/m   Physical Exam Chaperone present: Deferred as had performed in July.  HENT:     Head: Normocephalic and atraumatic.     Right Ear: Tympanic membrane, ear canal and external ear normal.     Left Ear: Tympanic membrane, ear canal and external ear normal.     Nose: Nose normal.     Mouth/Throat:     Mouth: Mucous membranes are moist.     Pharynx: Oropharynx is clear. Uvula midline.  Eyes:     Extraocular Movements: Extraocular movements intact.     Conjunctiva/sclera: Conjunctivae normal.     Pupils: Pupils are equal, round, and reactive to light.  Neck:     Thyroid: No thyroid mass or thyromegaly.  Cardiovascular:     Rate and Rhythm: Normal rate and regular rhythm.     Heart sounds: S1 normal and S2 normal. No murmur heard. No friction rub. No S3 or S4 sounds.      Comments: No carotid bruits.  Carotid, radial, femoral, DP and PT pulses normal and equal.  Pulmonary:     Effort: Pulmonary effort is normal.     Breath sounds: Normal breath sounds.  Chest:  Breasts:     Right: No axillary adenopathy or supraclavicular adenopathy.     Left: No axillary adenopathy or supraclavicular adenopathy.    Abdominal:     General: Bowel sounds are normal.     Palpations: Abdomen is soft. There is no hepatomegaly, splenomegaly or mass.     Tenderness: There is no abdominal tenderness.     Hernia: No hernia is present.  Genitourinary:    Comments: GU exam deferred as performed in July at [facility]. Musculoskeletal:        General: Normal range of motion.     Cervical back: Normal range of motion and neck supple.     Right lower leg: No edema.     Left lower leg: No edema.       Feet:     Comments: Mildly tender L/S paraspinous muscle tenderness.  Lymphadenopathy:     Head:     Right side of head: No submental or submandibular adenopathy.     Left side of head: No submental or submandibular adenopathy.     Cervical: No  cervical adenopathy.     Upper Body:     Right upper body: No supraclavicular or axillary adenopathy.     Left upper body: No supraclavicular or axillary adenopathy.  Lower Body: No right inguinal adenopathy. No left inguinal adenopathy.  Skin:    General: Skin is warm.     Capillary Refill: Capillary refill takes less than 2 seconds.     Findings: No rash.  Neurological:     Mental Status: She is alert and oriented to person, place, and time.     Cranial Nerves: Cranial nerves are intact.     Sensory: Sensation is intact.     Motor: Motor function is intact.     Coordination: Coordination is intact.     Gait: Gait is intact.     Deep Tendon Reflexes: Reflexes are normal and symmetric.  Psychiatric:        Attention and Perception: Attention and perception normal.        Mood and Affect: Mood normal.        Speech: Speech normal.        Behavior: Behavior normal. Behavior is cooperative.        Thought Content: Thought content normal.        Judgment: Judgment normal.    Assessment & Plan  1.  CPE without pap Prefers to wait to rediscuss mammogram next year. Refuses flu vaccine Schedule COVID 19 booster. Return in next month for fasting labs:  FLP, A1C, CBC, CMP Dental referral  2.  Hyperparathyroidism:  Will wait until beginning of year to send gen surgery referral again as closed currently.  To return in next month for fasting labs, including calcium.  3.  Left ankle/foot pain and bilateral low back pain:  Xray of ankle/foot.  Encouraged working on lifestyle changes for weight loss. PT referral.

## 2020-10-23 ENCOUNTER — Other Ambulatory Visit (INDEPENDENT_AMBULATORY_CARE_PROVIDER_SITE_OTHER): Payer: Self-pay | Admitting: Internal Medicine

## 2020-10-23 DIAGNOSIS — Z1211 Encounter for screening for malignant neoplasm of colon: Secondary | ICD-10-CM

## 2020-10-23 LAB — POC HEMOCCULT BLD/STL (HOME/3-CARD/SCREEN)
Card #2 Fecal Occult Blod, POC: NEGATIVE
Card #3 Fecal Occult Blood, POC: NEGATIVE
Fecal Occult Blood, POC: NEGATIVE

## 2020-10-23 NOTE — Progress Notes (Signed)
Brought in guaiac cards --all negative, but no dates.

## 2020-10-29 ENCOUNTER — Other Ambulatory Visit: Payer: Self-pay | Admitting: Internal Medicine

## 2020-11-04 ENCOUNTER — Encounter: Payer: Self-pay | Admitting: Internal Medicine

## 2020-11-04 DIAGNOSIS — E669 Obesity, unspecified: Secondary | ICD-10-CM | POA: Insufficient documentation

## 2020-11-04 DIAGNOSIS — E213 Hyperparathyroidism, unspecified: Secondary | ICD-10-CM | POA: Insufficient documentation

## 2020-11-21 ENCOUNTER — Telehealth: Payer: Self-pay | Admitting: Internal Medicine

## 2020-11-21 DIAGNOSIS — M25571 Pain in right ankle and joints of right foot: Secondary | ICD-10-CM

## 2020-11-21 NOTE — Telephone Encounter (Signed)
Patient stating it is not her left ankle but right ankle that is in pain after fall--her right low back as well.   [facility] calling to get a new order Written and faxed externally

## 2020-12-06 ENCOUNTER — Other Ambulatory Visit: Payer: Self-pay | Admitting: Surgery

## 2020-12-06 ENCOUNTER — Other Ambulatory Visit (HOSPITAL_COMMUNITY): Payer: Self-pay | Admitting: Surgery

## 2020-12-06 DIAGNOSIS — E210 Primary hyperparathyroidism: Secondary | ICD-10-CM

## 2020-12-10 ENCOUNTER — Other Ambulatory Visit: Payer: Self-pay

## 2020-12-10 ENCOUNTER — Other Ambulatory Visit: Payer: Self-pay | Admitting: Internal Medicine

## 2020-12-10 NOTE — Progress Notes (Signed)
Unable to obtain blood.   She is not well hydrated today--will reschedule and patient will make sure she has hydrated well.

## 2020-12-21 ENCOUNTER — Other Ambulatory Visit: Payer: Self-pay

## 2020-12-21 ENCOUNTER — Encounter (HOSPITAL_COMMUNITY)
Admission: RE | Admit: 2020-12-21 | Discharge: 2020-12-21 | Disposition: A | Discharge status: Home / Self Care | Payer: No Typology Code available for payment source | Source: Ambulatory Visit | Attending: Surgery | Admitting: Surgery

## 2020-12-21 ENCOUNTER — Ambulatory Visit (HOSPITAL_COMMUNITY)
Admission: RE | Admit: 2020-12-21 | Discharge: 2020-12-21 | Disposition: A | Discharge status: Home / Self Care | Payer: Self-pay | Source: Ambulatory Visit | Attending: Surgery | Admitting: Surgery

## 2020-12-21 DIAGNOSIS — E210 Primary hyperparathyroidism: Secondary | ICD-10-CM | POA: Insufficient documentation

## 2020-12-21 MED ORDER — TECHNETIUM TC 99M SESTAMIBI GENERIC - CARDIOLITE
25.0000 | Freq: Once | INTRAVENOUS | Status: AC | PRN
  Administered 2020-12-21: 25 via INTRAVENOUS

## 2021-01-07 ENCOUNTER — Other Ambulatory Visit: Payer: Self-pay

## 2021-01-07 ENCOUNTER — Other Ambulatory Visit (INDEPENDENT_AMBULATORY_CARE_PROVIDER_SITE_OTHER): Payer: Self-pay | Admitting: Internal Medicine

## 2021-01-07 DIAGNOSIS — Z13220 Encounter for screening for lipoid disorders: Secondary | ICD-10-CM

## 2021-01-07 DIAGNOSIS — R739 Hyperglycemia, unspecified: Secondary | ICD-10-CM

## 2021-01-07 DIAGNOSIS — E213 Hyperparathyroidism, unspecified: Secondary | ICD-10-CM

## 2021-01-07 DIAGNOSIS — Z0000 Encounter for general adult medical examination without abnormal findings: Secondary | ICD-10-CM

## 2021-01-07 NOTE — Progress Notes (Signed)
Unable to obtain specimen--sent to [facility] drawing station on [address].

## 2021-01-08 LAB — COMPREHENSIVE METABOLIC PANEL
ALT: 15 IU/L (ref 0–32)
AST: 16 IU/L (ref 0–40)
Albumin/Globulin Ratio: 1.7 (ref 1.2–2.2)
Albumin: 4.5 g/dL (ref 3.8–4.8)
Alkaline Phosphatase: 111 IU/L (ref 44–121)
BUN/Creatinine Ratio: 16 (ref 9–23)
BUN: 10 mg/dL (ref 6–24)
Bilirubin Total: 0.6 mg/dL (ref 0.0–1.2)
CO2: 20 mmol/L (ref 20–29)
Calcium: 11.7 mg/dL — ABNORMAL HIGH (ref 8.7–10.2)
Chloride: 105 mmol/L (ref 96–106)
Creatinine, Ser: 0.63 mg/dL (ref 0.57–1.00)
Globulin, Total: 2.7 g/dL (ref 1.5–4.5)
Glucose: 90 mg/dL (ref 65–99)
Potassium: 4.3 mmol/L (ref 3.5–5.2)
Sodium: 139 mmol/L (ref 134–144)
Total Protein: 7.2 g/dL (ref 6.0–8.5)
eGFR: 112 mL/min/{1.73_m2} (ref >59)

## 2021-01-08 LAB — CBC WITH DIFFERENTIAL/PLATELET
Basophils Absolute: 0 10*3/uL (ref 0.0–0.2)
Basos: 0 %
EOS (ABSOLUTE): 0.1 10*3/uL (ref 0.0–0.4)
Eos: 2 %
Hematocrit: 42.5 % (ref 34.0–46.6)
Hemoglobin: 14.3 g/dL (ref 11.1–15.9)
Immature Grans (Abs): 0 10*3/uL (ref 0.0–0.1)
Immature Granulocytes: 0 %
Lymphocytes Absolute: 2.1 10*3/uL (ref 0.7–3.1)
Lymphs: 43 %
MCH: 30.6 pg (ref 26.6–33.0)
MCHC: 33.6 g/dL (ref 31.5–35.7)
MCV: 91 fL (ref 79–97)
Monocytes Absolute: 0.5 10*3/uL (ref 0.1–0.9)
Monocytes: 10 %
Neutrophils Absolute: 2.2 10*3/uL (ref 1.4–7.0)
Neutrophils: 45 %
Platelets: 202 10*3/uL (ref 150–450)
RBC: 4.67 x10E6/uL (ref 3.77–5.28)
RDW: 12 % (ref 11.7–15.4)
WBC: 4.9 10*3/uL (ref 3.4–10.8)

## 2021-01-08 LAB — LIPID PANEL W/O CHOL/HDL RATIO
Cholesterol, Total: 171 mg/dL (ref 100–199)
HDL: 51 mg/dL (ref >39)
LDL Chol Calc (NIH): 109 mg/dL — ABNORMAL HIGH (ref 0–99)
Triglycerides: 54 mg/dL (ref 0–149)
VLDL Cholesterol Cal: 11 mg/dL (ref 5–40)

## 2021-01-08 LAB — HGB A1C W/O EAG: Hgb A1c MFr Bld: 5.6 % (ref 4.8–5.6)

## 2021-02-22 ENCOUNTER — Other Ambulatory Visit: Payer: Self-pay | Admitting: Surgery

## 2021-03-28 NOTE — Patient Instructions (Signed)
DUE TO COVID-19 ONLY ONE VISITOR IS ALLOWED TO COME WITH YOU AND STAY IN THE WAITING ROOM ONLY DURING PRE OP AND PROCEDURE DAY OF SURGERY. THE 2 VISITORS  MAY VISIT WITH YOU AFTER SURGERY IN YOUR PRIVATE ROOM DURING VISITING HOURS ONLY!  YOU NEED TO HAVE A COVID 19 TEST ON_6/6______ @_______ , THIS TEST MUST BE DONE BEFORE SURGERY,  COVID TESTING SITE [address] [place] [address], IT IS ON THE RIGHT GOING OUT [address] APPROXIMATELY  2 MINUTES PAST [employer] ON THE RIGHT. ONCE YOUR COVID TEST IS COMPLETED,  PLEASE BEGIN THE QUARANTINE INSTRUCTIONS AS OUTLINED IN YOUR HANDOUT.                Casey Mayo    Your procedure is scheduled on:04/04/21    Report to [facility] Main  Entrance   Report to admitting at 6:45 AM     Call this number if you have problems the morning of surgery [phone]    Remember: Do not eat food after Midnight.  You may have clear liquids until 5:30 am   BRUSH YOUR TEETH MORNING OF SURGERY AND RINSE YOUR MOUTH OUT, NO CHEWING GUM CANDY OR MINTS.     Take these medicines the morning of surgery with A SIP OF WATER: none                                You may not have any metal on your body including hair pins and              piercings  Do not wear jewelry, make-up, lotions, powders or perfumes, deodorant             Do not wear nail polish on your fingernails.  Do not shave  48 hours prior to surgery.     Do not bring valuables to the hospital. [place].  Contacts, dentures or bridgework may not be worn into surgery.      Patients discharged the day of surgery will not be allowed to drive home.   IF YOU ARE HAVING SURGERY AND GOING HOME THE SAME DAY, YOU MUST HAVE AN ADULT TO DRIVE YOU HOME AND BE WITH YOU FOR 24 HOURS. YOU MAY GO HOME BY TAXI OR UBER OR ORTHERWISE, BUT AN ADULT MUST ACCOMPANY YOU HOME AND STAY WITH YOU FOR 24 HOURS.  Name and phone number of your  driver:  Special Instructions: N/A              Please read over the following fact sheets you were given: _____________________________________________________________________             [facility] - Preparing for Surgery Before surgery, you can play an important role.  Because skin is not sterile, your skin needs to be as free of germs as possible.  You can reduce the number of germs on your skin by washing with CHG (chlorahexidine gluconate) soap before surgery.  CHG is an antiseptic cleaner which kills germs and bonds with the skin to continue killing germs even after washing. Please DO NOT use if you have an allergy to CHG or antibacterial soaps.  If your skin becomes reddened/irritated stop using the CHG and inform your nurse when you arrive at Short Stay. Do not shave (including legs and  underarms) for at least 48 hours prior to the first CHG shower.   Please follow these instructions carefully:  1.  Shower with CHG Soap the night before surgery and the  morning of Surgery.  2.  If you choose to wash your hair, wash your hair first as usual with your  normal  shampoo.  3.  After you shampoo, rinse your hair and body thoroughly to remove the  shampoo.                                        4.  Use CHG as you would any other liquid soap.  You can apply chg directly  to the skin and wash                       Gently with a scrungie or clean washcloth.  5.  Apply the CHG Soap to your body ONLY FROM THE NECK DOWN.   Do not use on face/ open                           Wound or open sores. Avoid contact with eyes, ears mouth and genitals (private parts).                       Wash face,  Genitals (private parts) with your normal soap.             6.  Wash thoroughly, paying special attention to the area where your surgery  will be performed.  7.  Thoroughly rinse your body with warm water from the neck down.  8.  DO NOT shower/wash with your normal soap after using and rinsing off  the CHG  Soap.             9.  Pat yourself dry with a clean towel.            10.  Wear clean pajamas.            11.  Place clean sheets on your bed the night of your first shower and do not  sleep with pets. Day of Surgery : Do not apply any lotions/deodorants the morning of surgery.  Please wear clean clothes to the hospital/surgery center.  FAILURE TO FOLLOW THESE INSTRUCTIONS MAY RESULT IN THE CANCELLATION OF YOUR SURGERY PATIENT SIGNATURE_________________________________  NURSE SIGNATURE__________________________________  ________________________________________________________________________

## 2021-03-29 ENCOUNTER — Encounter (HOSPITAL_COMMUNITY)
Admission: RE | Admit: 2021-03-29 | Discharge: 2021-03-29 | Disposition: A | Discharge status: Home / Self Care | Payer: No Typology Code available for payment source | Source: Ambulatory Visit | Attending: Surgery | Admitting: Surgery

## 2021-03-29 ENCOUNTER — Encounter (HOSPITAL_COMMUNITY): Payer: Self-pay

## 2021-03-29 ENCOUNTER — Other Ambulatory Visit: Payer: Self-pay

## 2021-03-29 DIAGNOSIS — Z01818 Encounter for other preprocedural examination: Secondary | ICD-10-CM | POA: Insufficient documentation

## 2021-03-29 NOTE — Progress Notes (Signed)
COVID Vaccine Completed:Yes Date COVID Vaccine completed:02/04/20- booster date unknown COVID vaccine manufacturer: Pfizer     PCP - Dr. Jacqualine Code Cardiologist - none  Chest x-ray -no  EKG - no Stress Test - no ECHO - no Cardiac Cath - na Pacemaker/ICD device last checked:NA  Sleep Study - no CPAP -   Fasting Blood Sugar - NA Checks Blood Sugar _____ times a day  Blood Thinner Instructions:NA Aspirin Instructions: Last Dose:  Anesthesia review:   Patient denies shortness of breath, fever, cough and chest pain at PAT appointment Pt has no SOB climbing stairs, doing house work or with ADLs.  Patient verbalized understanding of instructions that were given to them at the PAT appointment. Patient was also instructed that they will need to review over the PAT instructions again at home before surgery.Yes Pt has no medical issues.. Her Mother passed away in her sleep 1 year ago. Pt has never had anesthesia and is scared and nervious about the surgery.

## 2021-04-01 ENCOUNTER — Other Ambulatory Visit (HOSPITAL_COMMUNITY)
Admission: RE | Admit: 2021-04-01 | Discharge: 2021-04-01 | Disposition: A | Discharge status: Home / Self Care | Payer: No Typology Code available for payment source | Source: Ambulatory Visit | Attending: Surgery | Admitting: Surgery

## 2021-04-01 DIAGNOSIS — Z01812 Encounter for preprocedural laboratory examination: Secondary | ICD-10-CM | POA: Insufficient documentation

## 2021-04-01 DIAGNOSIS — Z20822 Contact with and (suspected) exposure to covid-19: Secondary | ICD-10-CM | POA: Insufficient documentation

## 2021-04-01 LAB — SARS CORONAVIRUS 2 (TAT 6-24 HRS): SARS Coronavirus 2: NEGATIVE

## 2021-04-03 NOTE — H&P (Addendum)
Casey Mayo  Location: [facility] Patient #: [id number] DOB: 1976-08-06 Married / Language: Other / Race: Black or African American Female   History of Present Illness  The patient is a 45 year old female who presents with hyperthyroidism.  Chief complaint: Primary hyperparathyroidism  This is a 45 year old female referred here for evaluation of possible primary hyperparathyroidism. She's been found over several years to have elevated calcium levels. Her parathyroid hormone levels been as high as 72. Referral to surgery is been recommended in the past she is deferred. She is now ready to at least discuss surgery. She has no signs or symptoms of hypercalcemia. She denies bone pain, abdominal pain, history of kidney stones, etc. her most recent calcium levels 11.1. She is otherwise healthy without complaints. There is no family history of malignancies   Past Surgical History  No pertinent past surgical history   Diagnostic Studies History  Colonoscopy  never Mammogram  1-3 years ago Pap Smear  1-5 years ago  Allergies ( No Known Drug Allergies   No Known Allergies  Allergies Reconciled   Medication History  Famotidine (10MG /ML Solution, Intravenous) Active. Levonorgestrel (0.75MG  Tablet, Oral) Active. Medications Reconciled  Social History  Caffeine use  Coffee. No alcohol use  No drug use  Tobacco use  Never smoker.  Family History  Family history unknown  First Degree Relatives   Pregnancy / Birth History  Contraceptive History  Intrauterine device. Gravida  6 Length (months) of breastfeeding  12-24 Maternal age  97-25 Para  4 Regular periods   Other Problems  Back Pain     Review of Systems General Present- Night Sweats. Not Present- Appetite Loss, Chills, Fatigue, Fever, Weight Gain and Weight Loss. Skin Present- Dryness. Not Present- Change in Wart/Mole, Hives, Jaundice, New Lesions, Non-Healing Wounds, Rash  and Ulcer. HEENT Not Present- Earache, Hearing Loss, Hoarseness, Nose Bleed, Oral Ulcers, Ringing in the Ears, Seasonal Allergies, Sinus Pain, Sore Throat, Visual Disturbances, Wears glasses/contact lenses and Yellow Eyes. Respiratory Present- Snoring. Not Present- Bloody sputum, Chronic Cough, Difficulty Breathing and Wheezing. Cardiovascular Present- Leg Cramps and Swelling of Extremities. Not Present- Chest Pain, Difficulty Breathing Lying Down, Palpitations, Rapid Heart Rate and Shortness of Breath. Gastrointestinal Present- Constipation. Not Present- Abdominal Pain, Bloating, Bloody Stool, Change in Bowel Habits, Chronic diarrhea, Difficulty Swallowing, Excessive gas, Gets full quickly at meals, Hemorrhoids, Indigestion, Nausea, Rectal Pain and Vomiting. Female Genitourinary Present- Nocturia and Pelvic Pain. Not Present- Frequency, Painful Urination and Urgency. Musculoskeletal Present- Back Pain. Not Present- Joint Pain, Joint Stiffness, Muscle Pain, Muscle Weakness and Swelling of Extremities. Neurological Present- Headaches. Not Present- Decreased Memory, Fainting, Numbness, Seizures, Tingling, Tremor, Trouble walking and Weakness. Psychiatric Not Present- Anxiety, Bipolar, Change in Sleep Pattern, Depression, Fearful and Frequent crying.  Vitals  Weight: 222.13 lb Temp.: 98.64F  Pulse: 85 (Regular)  P.OX: 99% (Room air) BP: 128/80(Sitting, Left Arm, Standard     Physical Exam The physical exam findings are as follows: Note: On exam, she appears well  Her neck is normal. There are no neck masses. There is no thyromegaly. There is no adenopathy. Her voice is normal  Lungs clear  CV  RRR  Abdomen soft, NT  Neuro grossly intact    Assessment & Plan   PRIMARY HYPERPARATHYROIDISM (E21.0)  Impression: I reviewed her notes in the electronic medical records. I reviewed her laboratory data. She appears to have primary hyperparathyroidism. I had a long discussion with  her regarding parathyroid disease and elevated calcium levels.  I gave her literature regarding this. We discussed the possibilities of problems can arise in the body including CNS, cardiac, bone, kidney problems, etc. The first step would be to try to localize the parathyroid adenoma. She needs a sestamibi scan as well as an ultrasound of the neck to see if an adenoma to be localized. After long discussion, she at least agrees to proceed with imaging studies. She may, however, refuse any surgical intervention. I will see her back to discuss results of the scans.  Addendum: She is here for follow-up regarding her neck ultrasound and sestamibi scan. She has no complaints currently.  The ultrasound of her neck showed a 2.1 cm nodule inferior to the right thyroid lobe suggestive of parathyroid adenoma. 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 I discussed the results of the scans with her. Surgical excision of the adenomas recommended to help control her hypercalcemia. After thinking about it she has now decided she does wish to proceed with surgery. We again discussed the surgical procedure in detail. We again discussed the risks and detail. These risks include but are not limited to bleeding, infection, injury to surrounding structures including the recurrent laryngeal nerve, hoarseness, and need for further surgery if hypercalcemia persists, cardiopulmonary issues, etc. We also discussed postoperative recovery. She understands and wished to proceed with surgery

## 2021-04-03 NOTE — Anesthesia Preprocedure Evaluation (Addendum)
Anesthesia Evaluation  Patient identified by MRN, date of birth, ID band Patient awake    Reviewed: Allergy & Precautions, NPO status , Patient's Chart, lab work & pertinent test results  History of Anesthesia Complications Negative for: history of anesthetic complications  Airway Mallampati: II  TM Distance: >3 FB Neck ROM: Full    Dental  (+) Caps, Dental Advisory Given   Pulmonary neg pulmonary ROS,  04/01/2021 SARS coronavirus NEG   breath sounds clear to auscultation       Cardiovascular (-) anginanegative cardio ROS   Rhythm:Regular Rate:Normal     Neuro/Psych negative neurological ROS     GI/Hepatic Neg liver ROS, GERD  Controlled,  Endo/Other  Obese hyperparathyroid  Renal/GU negative Renal ROS     Musculoskeletal   Abdominal (+) + obese,   Peds  Hematology negative hematology ROS (+)   Anesthesia Other Findings   Reproductive/Obstetrics                            Anesthesia Physical Anesthesia Plan  ASA: II  Anesthesia Plan: General   Post-op Pain Management:    Induction:   PONV Risk Score and Plan: 3 and Ondansetron, Dexamethasone and Scopolamine patch - Pre-op  Airway Management Planned: Oral ETT  Additional Equipment: None  Intra-op Plan:   Post-operative Plan: Extubation in OR  Informed Consent: I have reviewed the patients History and Physical, chart, labs and discussed the procedure including the risks, benefits and alternatives for the proposed anesthesia with the patient or authorized representative who has indicated his/her understanding and acceptance.     Dental advisory given  Plan Discussed with: CRNA and Surgeon  Anesthesia Plan Comments:        Anesthesia Quick Evaluation

## 2021-04-04 ENCOUNTER — Encounter (HOSPITAL_COMMUNITY)
Admission: RE | Disposition: A | Discharge status: Home / Self Care | Payer: Self-pay | Source: Home / Self Care | Attending: Surgery

## 2021-04-04 ENCOUNTER — Encounter (HOSPITAL_COMMUNITY): Payer: Self-pay | Admitting: Surgery

## 2021-04-04 ENCOUNTER — Ambulatory Visit (HOSPITAL_COMMUNITY): Payer: No Typology Code available for payment source

## 2021-04-04 ENCOUNTER — Ambulatory Visit (HOSPITAL_COMMUNITY)
Admission: RE | Admit: 2021-04-04 | Discharge: 2021-04-04 | Disposition: A | Discharge status: Home / Self Care | Payer: No Typology Code available for payment source | Attending: Surgery | Admitting: Surgery

## 2021-04-04 DIAGNOSIS — Z793 Long term (current) use of hormonal contraceptives: Secondary | ICD-10-CM | POA: Insufficient documentation

## 2021-04-04 DIAGNOSIS — E210 Primary hyperparathyroidism: Secondary | ICD-10-CM | POA: Insufficient documentation

## 2021-04-04 DIAGNOSIS — D351 Benign neoplasm of parathyroid gland: Secondary | ICD-10-CM | POA: Insufficient documentation

## 2021-04-04 HISTORY — PX: MASS EXCISION: SHX2000

## 2021-04-04 LAB — PREGNANCY, URINE: Preg Test, Ur: NEGATIVE

## 2021-04-04 SURGERY — EXCISION MASS
Anesthesia: General | Laterality: Right

## 2021-04-04 MED ORDER — LIDOCAINE 2% (20 MG/ML) 5 ML SYRINGE
INTRAMUSCULAR | Status: AC
  Filled 2021-04-04: qty 5

## 2021-04-04 MED ORDER — PROMETHAZINE HCL 25 MG/ML IJ SOLN: 6.2500 mg | INTRAMUSCULAR | Status: DC | PRN

## 2021-04-04 MED ORDER — OXYCODONE HCL 5 MG/5ML PO SOLN: 5.0000 mg | Freq: Once | ORAL | Status: DC | PRN

## 2021-04-04 MED ORDER — PROPOFOL 10 MG/ML IV BOLUS
INTRAVENOUS | Status: AC
  Filled 2021-04-04: qty 20

## 2021-04-04 MED ORDER — MEPERIDINE HCL 50 MG/ML IJ SOLN: 6.2500 mg | INTRAMUSCULAR | Status: DC | PRN

## 2021-04-04 MED ORDER — ORAL CARE MOUTH RINSE: 15.0000 mL | Freq: Once | OROMUCOSAL | Status: AC

## 2021-04-04 MED ORDER — ONDANSETRON HCL 4 MG/2ML IJ SOLN
INTRAMUSCULAR | Status: DC | PRN
  Administered 2021-04-04: 4 mg via INTRAVENOUS

## 2021-04-04 MED ORDER — OXYCODONE HCL 5 MG PO TABS: 5.0000 mg | ORAL_TABLET | Freq: Once | ORAL | Status: DC | PRN

## 2021-04-04 MED ORDER — ACETAMINOPHEN 500 MG PO TABS
1000.0000 mg | ORAL_TABLET | Freq: Once | ORAL | Status: DC
  Filled 2021-04-04: qty 2

## 2021-04-04 MED ORDER — 0.9 % SODIUM CHLORIDE (POUR BTL) OPTIME
TOPICAL | Status: DC | PRN
  Administered 2021-04-04: 1000 mL

## 2021-04-04 MED ORDER — CEFAZOLIN SODIUM-DEXTROSE 2-4 GM/100ML-% IV SOLN
2.0000 g | INTRAVENOUS | Status: AC
  Administered 2021-04-04: 2 g via INTRAVENOUS
  Filled 2021-04-04: qty 100

## 2021-04-04 MED ORDER — MIDAZOLAM HCL 5 MG/5ML IJ SOLN
INTRAMUSCULAR | Status: DC | PRN
  Administered 2021-04-04: 2 mg via INTRAVENOUS

## 2021-04-04 MED ORDER — MIDAZOLAM HCL 2 MG/2ML IJ SOLN: 0.5000 mg | Freq: Once | INTRAMUSCULAR | Status: DC | PRN

## 2021-04-04 MED ORDER — PROPOFOL 10 MG/ML IV BOLUS
INTRAVENOUS | Status: DC | PRN
  Administered 2021-04-04: 200 mg via INTRAVENOUS

## 2021-04-04 MED ORDER — CALCIUM CARBONATE ANTACID 500 MG PO CHEW: 2.0000 | CHEWABLE_TABLET | Freq: Three times a day (TID) | ORAL | 0 refills | Status: DC

## 2021-04-04 MED ORDER — CHLORHEXIDINE GLUCONATE 0.12 % MT SOLN
15.0000 mL | Freq: Once | OROMUCOSAL | Status: AC
  Administered 2021-04-04: 07:00:00 15 mL via OROMUCOSAL

## 2021-04-04 MED ORDER — FENTANYL CITRATE (PF) 100 MCG/2ML IJ SOLN
INTRAMUSCULAR | Status: DC | PRN
  Administered 2021-04-04: 50 ug via INTRAVENOUS
  Administered 2021-04-04: 100 ug via INTRAVENOUS
  Administered 2021-04-04 (×2): 50 ug via INTRAVENOUS

## 2021-04-04 MED ORDER — ACETAMINOPHEN 500 MG PO TABS
1000.0000 mg | ORAL_TABLET | ORAL | Status: AC
  Administered 2021-04-04: 1000 mg via ORAL

## 2021-04-04 MED ORDER — ONDANSETRON HCL 4 MG/2ML IJ SOLN
INTRAMUSCULAR | Status: AC
  Filled 2021-04-04: qty 2

## 2021-04-04 MED ORDER — LIDOCAINE 2% (20 MG/ML) 5 ML SYRINGE
INTRAMUSCULAR | Status: DC | PRN
  Administered 2021-04-04: 40 mg via INTRAVENOUS

## 2021-04-04 MED ORDER — ROCURONIUM BROMIDE 10 MG/ML (PF) SYRINGE
PREFILLED_SYRINGE | INTRAVENOUS | Status: AC
  Filled 2021-04-04: qty 10

## 2021-04-04 MED ORDER — BUPIVACAINE HCL (PF) 0.5 % IJ SOLN
INTRAMUSCULAR | Status: AC
  Filled 2021-04-04: qty 30

## 2021-04-04 MED ORDER — FENTANYL CITRATE (PF) 250 MCG/5ML IJ SOLN
INTRAMUSCULAR | Status: AC
  Filled 2021-04-04: qty 5

## 2021-04-04 MED ORDER — TRAMADOL HCL 50 MG PO TABS: 50.0000 mg | ORAL_TABLET | Freq: Four times a day (QID) | ORAL | 0 refills | Status: DC | PRN

## 2021-04-04 MED ORDER — DEXAMETHASONE SODIUM PHOSPHATE 10 MG/ML IJ SOLN
INTRAMUSCULAR | Status: AC
  Filled 2021-04-04: qty 1

## 2021-04-04 MED ORDER — SCOPOLAMINE 1 MG/3DAYS TD PT72
1.0000 | MEDICATED_PATCH | TRANSDERMAL | Status: DC
  Administered 2021-04-04: 1.5 mg via TRANSDERMAL
  Filled 2021-04-04: qty 1

## 2021-04-04 MED ORDER — CHLORHEXIDINE GLUCONATE CLOTH 2 % EX PADS: 6.0000 | MEDICATED_PAD | Freq: Once | CUTANEOUS | Status: DC

## 2021-04-04 MED ORDER — SUGAMMADEX SODIUM 200 MG/2ML IV SOLN
INTRAVENOUS | Status: DC | PRN
  Administered 2021-04-04: 200 mg via INTRAVENOUS

## 2021-04-04 MED ORDER — ROCURONIUM BROMIDE 10 MG/ML (PF) SYRINGE
PREFILLED_SYRINGE | INTRAVENOUS | Status: DC | PRN
  Administered 2021-04-04: 50 mg via INTRAVENOUS

## 2021-04-04 MED ORDER — HYDROMORPHONE HCL 1 MG/ML IJ SOLN: 0.2500 mg | INTRAMUSCULAR | Status: DC | PRN

## 2021-04-04 MED ORDER — BUPIVACAINE HCL (PF) 0.5 % IJ SOLN
INTRAMUSCULAR | Status: DC | PRN
  Administered 2021-04-04: 10 mL

## 2021-04-04 MED ORDER — DEXAMETHASONE SODIUM PHOSPHATE 10 MG/ML IJ SOLN
INTRAMUSCULAR | Status: DC | PRN
  Administered 2021-04-04: 10 mg via INTRAVENOUS

## 2021-04-04 MED ORDER — MIDAZOLAM HCL 2 MG/2ML IJ SOLN
INTRAMUSCULAR | Status: AC
  Filled 2021-04-04: qty 2

## 2021-04-04 MED ORDER — LACTATED RINGERS IV SOLN: INTRAVENOUS | Status: DC

## 2021-04-04 MED ORDER — ENSURE PRE-SURGERY PO LIQD: 296.0000 mL | Freq: Once | ORAL | Status: DC

## 2021-04-04 SURGICAL SUPPLY — 41 items
BLADE HEX COATED 2.75 (ELECTRODE) ×3 IMPLANT
BLADE SURG 15 STRL LF DISP TIS (BLADE) ×1 IMPLANT
BLADE SURG 15 STRL SS (BLADE) ×3
BLADE SURG SZ10 CARB STEEL (BLADE) ×3 IMPLANT
CHLORAPREP W/TINT 26 (MISCELLANEOUS) ×3 IMPLANT
CLIP VESOCCLUDE MED 6/CT (CLIP) ×6 IMPLANT
CLIP VESOCCLUDE SM WIDE 6/CT (CLIP) ×3 IMPLANT
CLOSURE WOUND 1/2 X4 (GAUZE/BANDAGES/DRESSINGS)
COVER WAND RF STERILE (DRAPES) IMPLANT
DECANTER SPIKE VIAL GLASS SM (MISCELLANEOUS) ×3 IMPLANT
DERMABOND ADVANCED (GAUZE/BANDAGES/DRESSINGS) ×2
DERMABOND ADVANCED .7 DNX12 (GAUZE/BANDAGES/DRESSINGS) ×1 IMPLANT
DISSECTOR ROUND CHERRY 3/8 STR (MISCELLANEOUS) ×3 IMPLANT
DRAIN PENROSE 0.5X18 (DRAIN) ×3 IMPLANT
DRAPE LAPAROTOMY TRNSV 102X78 (DRAPES) ×3 IMPLANT
ELECT REM PT RETURN 15FT ADLT (MISCELLANEOUS) ×3 IMPLANT
GAUZE 4X4 16PLY RFD (DISPOSABLE) IMPLANT
GAUZE SPONGE 4X4 12PLY STRL (GAUZE/BANDAGES/DRESSINGS) IMPLANT
GLOVE SURG ENC MOIS LTX SZ7.5 (GLOVE) ×3 IMPLANT
GOWN STRL REUS W/TWL LRG LVL3 (GOWN DISPOSABLE) ×3 IMPLANT
GOWN STRL REUS W/TWL XL LVL3 (GOWN DISPOSABLE) ×6 IMPLANT
HEMOSTAT SURGICEL 2X4 FIBR (HEMOSTASIS) ×3 IMPLANT
KIT BASIN OR (CUSTOM PROCEDURE TRAY) ×3 IMPLANT
KIT TURNOVER KIT A (KITS) ×3 IMPLANT
NEEDLE HYPO 22GX1.5 SAFETY (NEEDLE) IMPLANT
NEEDLE HYPO 25X1 1.5 SAFETY (NEEDLE) ×3 IMPLANT
NS IRRIG 1000ML POUR BTL (IV SOLUTION) ×3 IMPLANT
PACK BASIC VI WITH GOWN DISP (CUSTOM PROCEDURE TRAY) ×3 IMPLANT
PENCIL SMOKE EVACUATOR (MISCELLANEOUS) IMPLANT
SPONGE LAP 4X18 RFD (DISPOSABLE) ×3 IMPLANT
STRIP CLOSURE SKIN 1/2X4 (GAUZE/BANDAGES/DRESSINGS) IMPLANT
SUT MNCRL AB 4-0 PS2 18 (SUTURE) ×3 IMPLANT
SUT VIC AB 2-0 CT1 27 (SUTURE)
SUT VIC AB 2-0 CT1 TAPERPNT 27 (SUTURE) IMPLANT
SUT VIC AB 3-0 54XBRD REEL (SUTURE) IMPLANT
SUT VIC AB 3-0 BRD 54 (SUTURE)
SUT VIC AB 3-0 SH 27 (SUTURE) ×3
SUT VIC AB 3-0 SH 27XBRD (SUTURE) ×1 IMPLANT
SYR BULB IRRIG 60ML STRL (SYRINGE) IMPLANT
SYR CONTROL 10ML LL (SYRINGE) ×3 IMPLANT
TOWEL OR 17X26 10 PK STRL BLUE (TOWEL DISPOSABLE) ×3 IMPLANT

## 2021-04-04 NOTE — Anesthesia Procedure Notes (Signed)
Procedure Name: Intubation Date/Time: 04/04/2021 8:48 AM Performed by: Maxwell Caul, CRNA Pre-anesthesia Checklist: Patient identified, Emergency Drugs available, Suction available and Patient being monitored Patient Re-evaluated:Patient Re-evaluated prior to induction Oxygen Delivery Method: Circle system utilized Preoxygenation: Pre-oxygenation with 100% oxygen Induction Type: IV induction Ventilation: Mask ventilation without difficulty Laryngoscope Size: Mac and 4 Grade View: Grade I Tube type: Oral Tube size: 7.0 mm Number of attempts: 1 Airway Equipment and Method: Stylet Placement Confirmation: ETT inserted through vocal cords under direct vision, positive ETCO2 and breath sounds checked- equal and bilateral Secured at: 21 cm Tube secured with: Tape Dental Injury: Teeth and Oropharynx as per pre-operative assessment

## 2021-04-04 NOTE — Anesthesia Postprocedure Evaluation (Signed)
Anesthesia Post Note  Patient: Casey Mayo  Procedure(s) Performed: EXCISION RIGHT PARATHYROID ADENOMA (Right)     Patient location during evaluation: PACU Anesthesia Type: General Level of consciousness: awake and alert, patient cooperative and oriented Pain management: pain level controlled Vital Signs Assessment: post-procedure vital signs reviewed and stable Respiratory status: spontaneous breathing, nonlabored ventilation and respiratory function stable Cardiovascular status: blood pressure returned to baseline and stable Postop Assessment: no apparent nausea or vomiting and able to ambulate Anesthetic complications: no   No notable events documented.  Last Vitals:  Vitals:   04/04/21 1042 04/04/21 1043  BP: (!) 153/80 (!) 153/80  Pulse: 64 62  Resp:  20  Temp:  (!) 36.4 C  SpO2: 96% 97%    Last Pain:  Vitals:   04/04/21 1043  TempSrc: Oral  PainSc:                  Casey Mayo,E. Ashe Gago

## 2021-04-04 NOTE — Discharge Instructions (Signed)
[facility], [place] [phone]  THYROID/ PARATHYROID SURGERY: POST OP INSTRUCTIONS  Always review your discharge instruction sheet given to you by the facility where your surgery was performed.  IF YOU HAVE DISABILITY OR FAMILY LEAVE FORMS, YOU MUST BRING THEM TO THE OFFICE FOR PROCESSING.  PLEASE DO NOT GIVE THEM TO YOUR DOCTOR.  A prescription for pain medication may be given to you upon discharge.  Take your pain medication as prescribed, if needed.  If narcotic pain medicine is not needed, then you may take acetaminophen (Tylenol) or ibuprofen (Advil) as needed. Take your usually prescribed medications unless otherwise directed. If you need a refill on your pain medication, please contact your pharmacy. They will contact our office to request authorization.  Prescriptions will not be filled after 5pm or on week-ends. You should follow a light diet the first 24 hours after arrival home, such as soup and crackers, etc.  Be sure to include lots of fluids daily.  Resume your normal diet the day after surgery. Most patients will experience some swelling and bruising on the chest and neck area.  Ice packs will help.  Swelling and bruising can take several days to resolve.  It is common to experience some constipation if taking pain medication after surgery.  Increasing fluid intake and taking a stool softener will usually help or prevent this problem from occurring.  A mild laxative (Milk of Magnesia or Miralax) should be taken according to package directions if there are no bowel movements after 48 hours. Unless discharge instructions indicate otherwise, you may remove your bandages 24-48 hours after surgery, and you may shower at that time.  You may have steri-strips (small skin tapes) in place directly over the incision.  These strips should be left on the skin for 7-10 days.  If your surgeon used skin glue on the incision, you may shower in 24 hours.  The glue will flake off over  the next 2-3 weeks.  Any sutures or staples will be removed at the office during your follow-up visit. ACTIVITIES:  You may resume regular (light) daily activities beginning the next day--such as daily self-care, walking, climbing stairs--gradually increasing activities as tolerated.  You may have sexual intercourse when it is comfortable.  Refrain from any heavy lifting or straining until approved by your doctor. You may drive when you no longer are taking prescription pain medication, you can comfortably wear a seatbelt, and you can safely maneuver your car and apply brakes RETURN TO WORK:  __________________________________________________________ Dennis Bast should see your doctor in the office for a follow-up appointment approximately two weeks after your surgery.  Make sure that you call for this appointment within a day or two after you arrive home to insure a convenient appointment time. OTHER INSTRUCTIONS: ___OK TO SHOWER STARTING TOMORROW ICE PACK, TYLENOL, AND IBUPROFEN ALSO FOR PAIN TAKE 2 TUMS WITH CALCIUM 3 TIMES DAILY_________________________________________________________________________ _________________________________________________________________________________________________________________ _________________________________________________________________________________________________________________   WHEN TO CALL YOUR DOCTOR: Fever over 101.0 Inability to urinate Nausea and/or vomiting Extreme swelling or bruising Continued bleeding from incision. Increased pain, redness, or drainage from the incision. Difficulty swallowing or breathing Muscle cramping or spasms. Numbness or tingling in hands or feet or around lips.  The clinic staff is available to answer your questions during regular business hours.  Please don't hesitate to call and ask to speak to one of the nurses if you have concerns.  For further questions, please visit [email or web address]

## 2021-04-04 NOTE — Transfer of Care (Signed)
Immediate Anesthesia Transfer of Care Note  Patient: Casey Mayo  Procedure(s) Performed: EXCISION RIGHT PARATHYROID ADENOMA (Right)  Patient Location: PACU  Anesthesia Type:General  Level of Consciousness: awake, alert  and oriented  Airway & Oxygen Therapy: Patient Spontanous Breathing and Patient connected to face mask oxygen  Post-op Assessment: Report given to RN and Post -op Vital signs reviewed and stable  Post vital signs: Reviewed and stable  Last Vitals:  Vitals Value Taken Time  BP    Temp    Pulse 70 04/04/21 1005  Resp 22 04/04/21 1005  SpO2 100 % 04/04/21 1005  Vitals shown include unvalidated device data.  Last Pain:  Vitals:   04/04/21 0710  TempSrc:   PainSc: 0-No pain         Complications: No notable events documented.

## 2021-04-04 NOTE — Op Note (Signed)
   Casey Mayo 04/04/2021   Pre-op Diagnosis: RIGHT INFERIOR PARATHYROID ADENOMA     Post-op Diagnosis: SAME  Procedure(s): RIGHT INFERIOR PARATHYROIDECTOMY  Surgeon(s): Coralie Keens, MD  Anesthesia: General  Staff:  Circulator: Oretha Milch, RN Scrub Person: Romero Liner, CST; Marijean Niemann, Natoya A  Estimated Blood Loss: Minimal               Specimens: SENT TO PATH  Indications: This is a 45 year old female was found to have hypercalcemia.  She underwent a sestamibi scan and ultrasound of the neck.  This showed what appeared to be a 2.1 cm right inferior parathyroid adenoma.  After discussion with the patient the decision was made to proceed with parathyroidectomy  Findings: Frozen section confirmed a right inferior parathyroid adenoma  Procedure: The patient was brought to operating identifies correct patient.  She is placed upon the operating table general esthesia was induced.  Her neck was then prepped and draped in usual sterile fashion.  Using a skin fold, I made a transverse incision on the right side of the lower neck.  I took this down to the platysma and then created superior and inferior skin flaps.  The midline was identified and opened with the cautery.  I then retracted the strap muscles laterally.  I was able to easily identify the thyroid.  I dissected along the lower edge of the inferior pole of the thyroid.  1 small bridging vein had to be clipped with surgical clips.  I then dissected underneath the gland and laterally and identified the large parathyroid gland.  Again, it was approximate 2 cm in size.  I dissected out circumferentially.  Identified vessels to the parathyroid which I clipped with surgical clips.  I then excised what appeared to be the right inferior parathyroid adenoma.  This was sent to pathology for frozen section.  Frozen section confirmed an adenoma of the parathyroid gland.  At this point I irrigated the wound with  saline.  Hemostasis appeared to be achieved.  I placed fibrillar into the area of the dissection.  I then closed the midline with 2 interrupted 3-0 Vicryl sutures.  I then reapproximated the platysma with interrupted 3-0 Vicryl sutures and closed skin with running 4-0 Monocryl.  Dermabond was then applied.  The patient tolerated the procedure well.  All the counts were correct at the end of the procedure.  The patient was then extubated in the operating room and taken in stable condition to the recovery room.          Coralie Keens   Date: 04/04/2021  Time: 10:08 AM

## 2021-04-04 NOTE — Interval H&P Note (Signed)
History and Physical Interval Note: no change in H and P  04/04/2021 8:35 AM  Casey Mayo  has presented today for surgery, with the diagnosis of RIGHT PARATHYROID ADENOMA.  The various methods of treatment have been discussed with the patient and family. After consideration of risks, benefits and other options for treatment, the patient has consented to  Procedure(s): EXCISION RIGHT PARATHYROID ADENOMA (Right) as a surgical intervention.  The patient's history has been reviewed, patient examined, no change in status, stable for surgery.  I have reviewed the patient's chart and labs.  Questions were answered to the patient's satisfaction.     Coralie Keens

## 2021-04-05 ENCOUNTER — Encounter (HOSPITAL_COMMUNITY): Payer: Self-pay | Admitting: Surgery

## 2021-04-05 LAB — SURGICAL PATHOLOGY

## 2021-04-08 ENCOUNTER — Encounter: Payer: Self-pay | Admitting: Internal Medicine

## 2021-05-06 ENCOUNTER — Telehealth: Payer: Self-pay | Admitting: Internal Medicine

## 2021-05-06 NOTE — Telephone Encounter (Signed)
Pt scheduled for BMP at 10:30am on 05/08/21

## 2021-05-08 ENCOUNTER — Other Ambulatory Visit: Payer: Self-pay

## 2021-05-08 DIAGNOSIS — E213 Hyperparathyroidism, unspecified: Secondary | ICD-10-CM

## 2021-05-08 DIAGNOSIS — E0590 Thyrotoxicosis, unspecified without thyrotoxic crisis or storm: Secondary | ICD-10-CM

## 2021-05-09 LAB — BASIC METABOLIC PANEL
BUN/Creatinine Ratio: 14 (ref 9–23)
BUN: 9 mg/dL (ref 6–24)
CO2: 21 mmol/L (ref 20–29)
Calcium: 9.2 mg/dL (ref 8.7–10.2)
Chloride: 104 mmol/L (ref 96–106)
Creatinine, Ser: 0.64 mg/dL (ref 0.57–1.00)
Glucose: 108 mg/dL — ABNORMAL HIGH (ref 65–99)
Potassium: 4 mmol/L (ref 3.5–5.2)
Sodium: 138 mmol/L (ref 134–144)
eGFR: 112 mL/min/{1.73_m2} (ref >59)

## 2021-06-15 IMAGING — NM NM PARATHYROID W/ SPECT
4 series · 14 of 14 positions shown · non-contrast
Comparison: Thyroid ultrasound 12/21/2020

CLINICAL DATA: Elevated calcium level question parathyroid adenoma

EXAM:
NM PARATHYROID SCINTIGRAPHY AND SPECT IMAGING
TECHNIQUE: Following intravenous administration of radiopharmaceutical, early
and 2-hour delayed planar images were obtained in the anterior
projection. Delayed triplanar SPECT images were also obtained at 2
hours.
RADIOPHARMACEUTICALS:  24.5 mCi Xc-NNm Sestamibi IV

[Series 1: 15 min ant · 4.14mm/px · 1 of 1 slices shown]
[im 1/1]
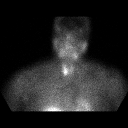

[Series 2: 2 hr ant · 4.14mm/px · 1 of 1 slices shown]
[im 1/1]
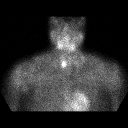

[Series 3: spect parathyroid · 4.14mm/px · 6 of 64 frames shown]
[frame 6/64  full-range]
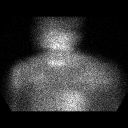
[frame 16/64  full-range]
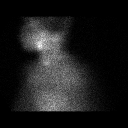
[frame 27/64  full-range]
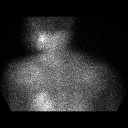
[frame 38/64  full-range]
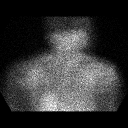
[frame 48/64  full-range]
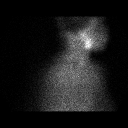
[frame 59/64  full-range]
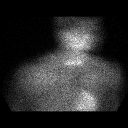

[Series 3: wbr_bone spect parathyroid · 4.1mm · 4.14mm/px · 6 of 128 frames shown]
[frame 11/128]
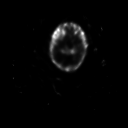
[frame 32/128]
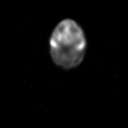
[frame 54/128]
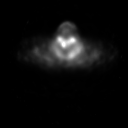
[frame 75/128]
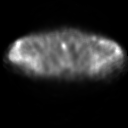
[frame 96/128]
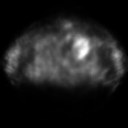
[frame 118/128]
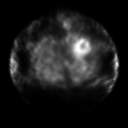

[14 of 14 positions shown; findings below may reference images not displayed]

FINDINGS: Planar images: Initial image demonstrates increased sestamibi
localization at the inferior pole of the RIGHT thyroid lobe. Delayed
images demonstrate washout of tracer from thyroid tissue with focal
abnormal sestamibi retention at the expected position of the RIGHT
inferior parathyroid gland. No ectopic localization of tracer.

SPECT imaging: Abnormal sestamibi retention at the expected position
of the RIGHT inferior parathyroid gland consistent with parathyroid
adenoma. No abnormal tracer retention at the expected positions of
the remaining parathyroid glands. No ectopic localization of tracer.
IMPRESSION: Positive exam for presence of a RIGHT inferior parathyroid adenoma.

## 2021-06-15 IMAGING — US US THYROID
1 series · 14 of 25 positions shown · non-contrast
Comparison: None available

CLINICAL DATA: Primary hyperparathyroidism

EXAM:
THYROID ULTRASOUND
TECHNIQUE: Ultrasound examination of the thyroid gland and adjacent soft
tissues was performed.

[Series 1: us thyroid · 14 of 60 slices shown]
[im 1/60]
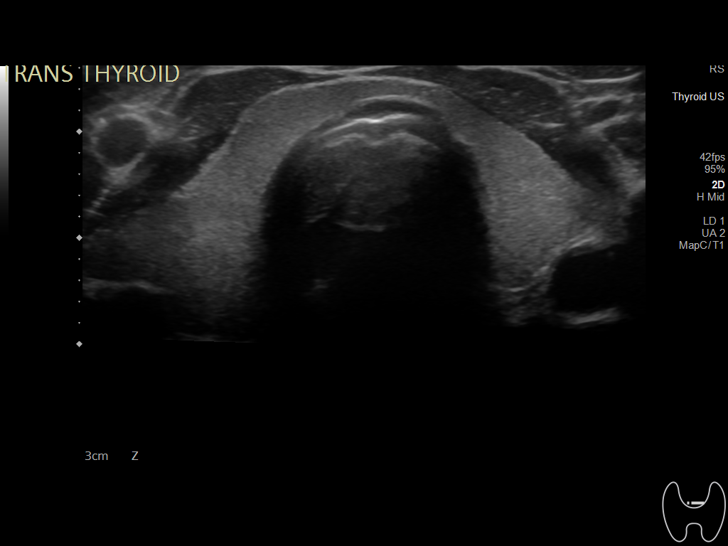
[im 5/60]
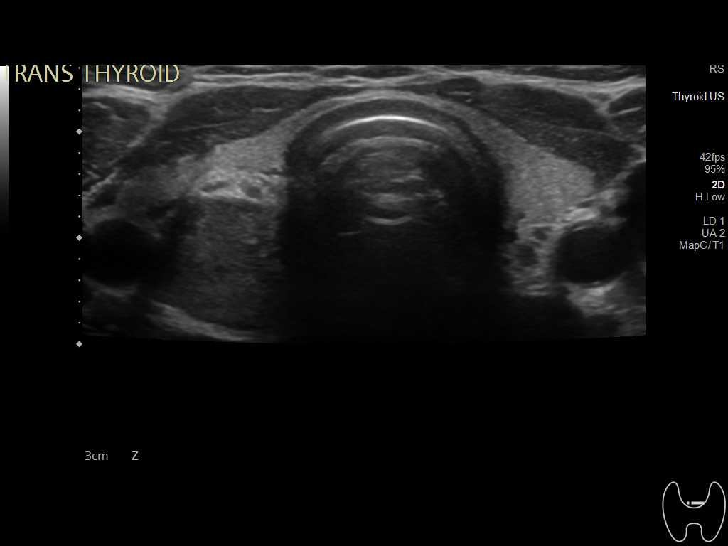
[im 10/60]
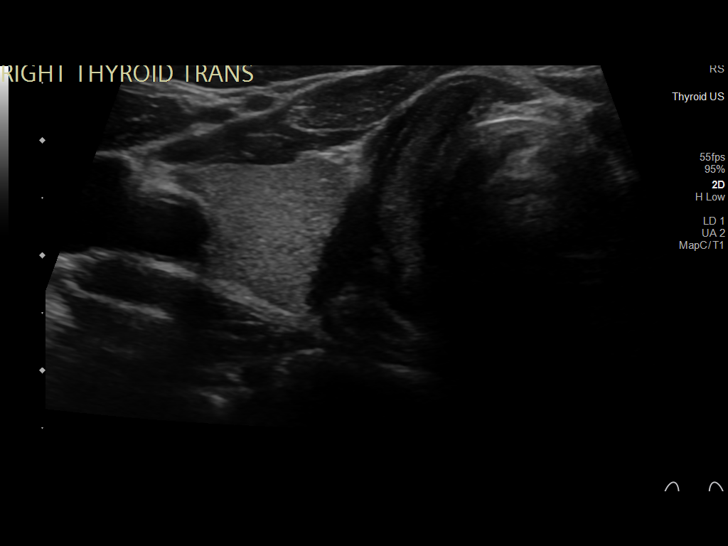
[im 15/60]
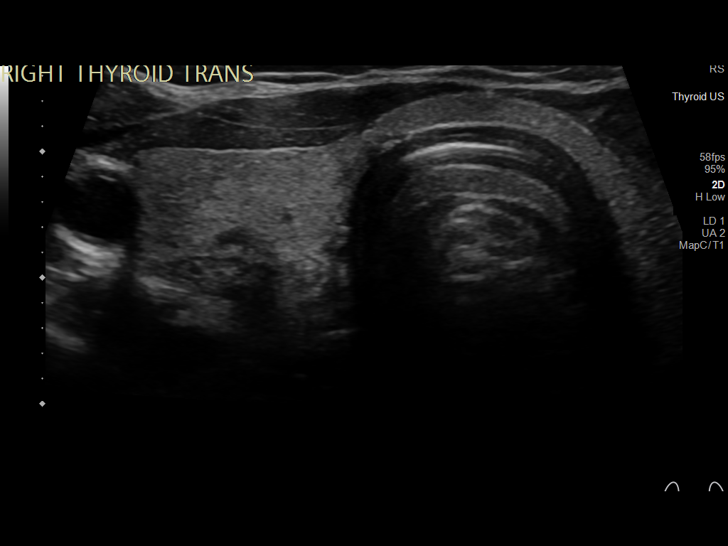
[im 20/60]
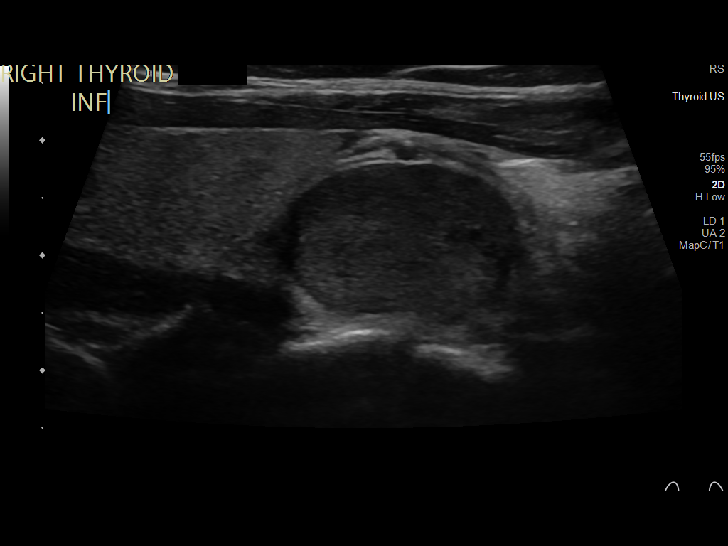
[im 23/60]
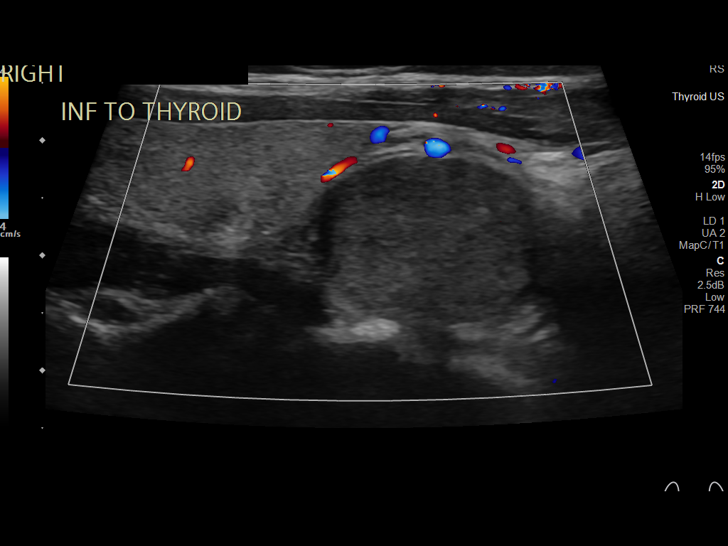
[im 28/60]
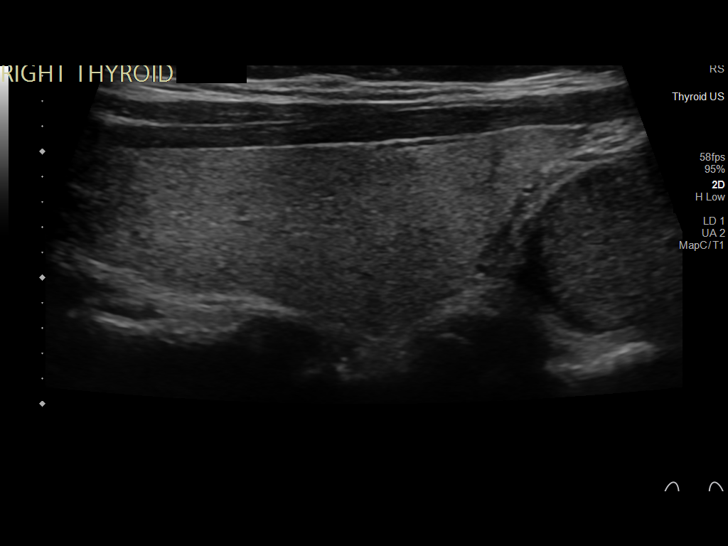
[im 32/60]
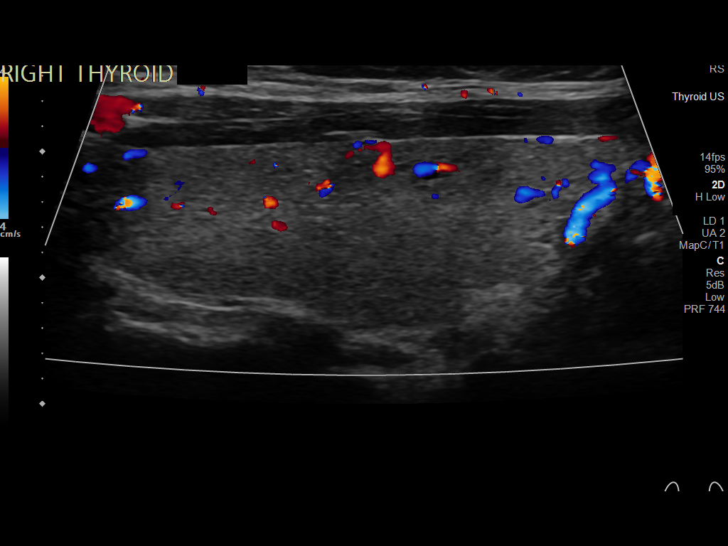
[im 37/60]
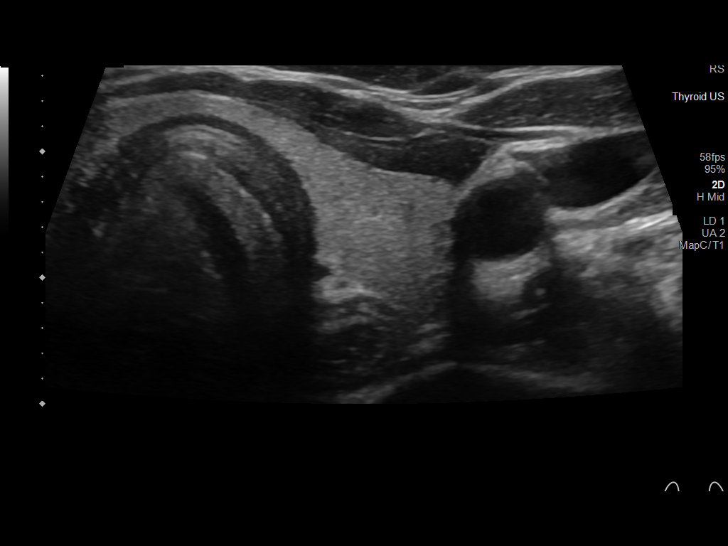
[im 40/60]
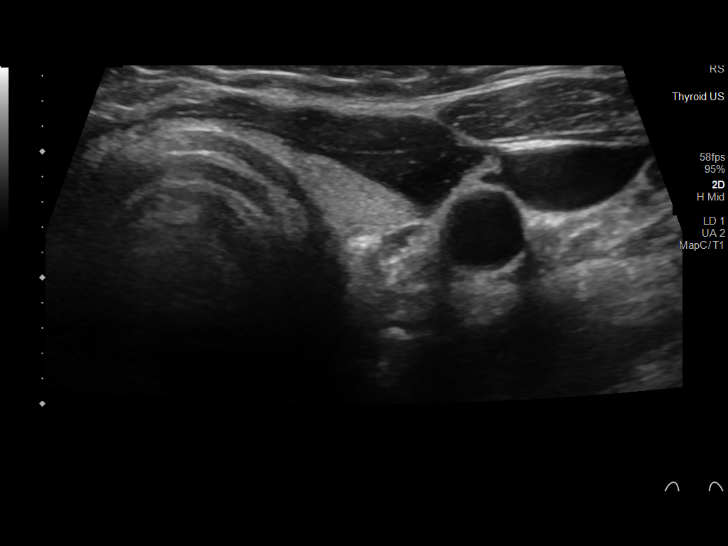
[im 45/60]
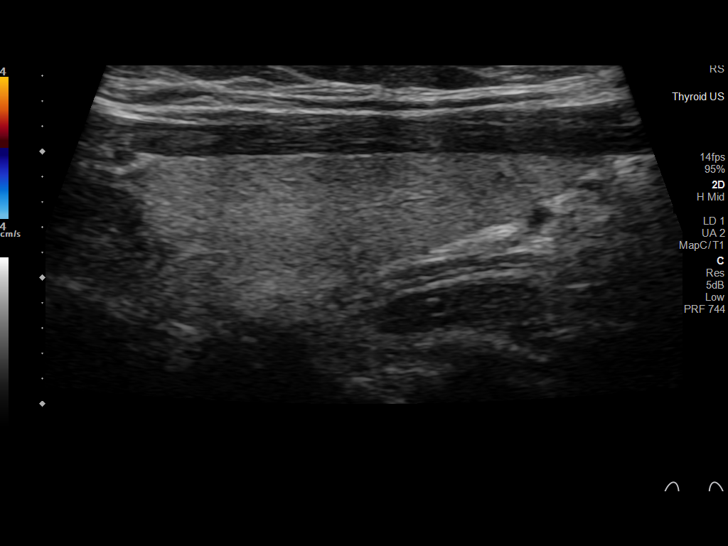
[im 50/60]
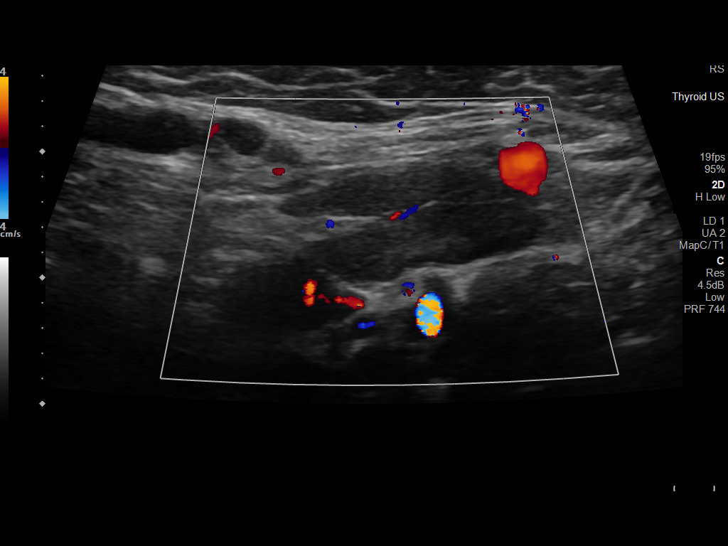
[im 55/60]
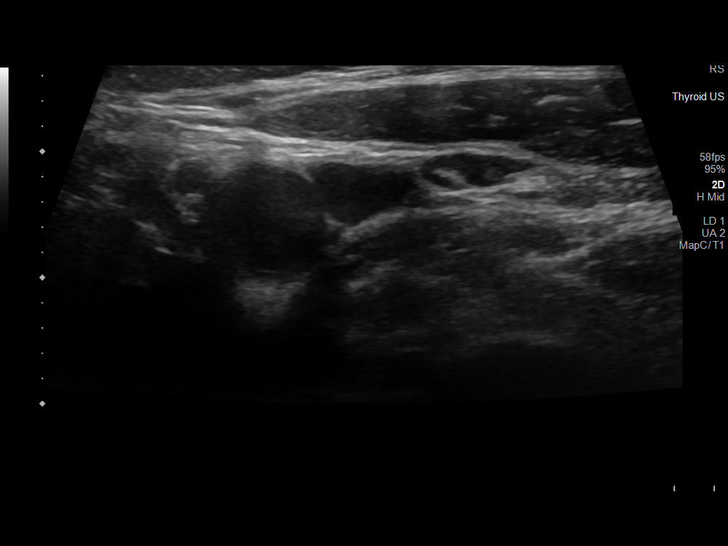
[im 60/60]
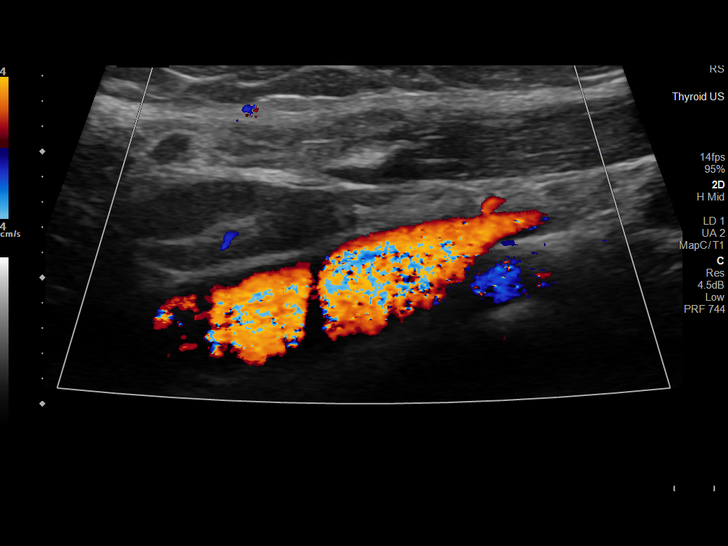

[14 of 25 positions shown; findings below may reference images not displayed]

FINDINGS: Parenchymal Echotexture: Normal

Isthmus: 0.3 cm thickness

Right lobe: 4.3 x 1.6 x 1.5 cm

Left lobe: 3.7 x 1.2 x 1.2 cm

_________________________________________________________

Estimated total number of nodules >/= 1 cm: 0

Number of spongiform nodules >/=  2 cm not described below (TR1): 0

Number of mixed cystic and solid nodules >/= 1.5 cm not described
below (TR2): 0

_________________________________________________________

No discrete nodules are seen within the thyroid gland. No regional
adenopathy.

2.1 x 1.4 x 1.5 cm hypoechoic solid nodule deep and inferior to the
right lobe of the thyroid.
IMPRESSION: 1. Normal thyroid.
2. 2.1 cm nodule inferior to the right lobe suggesting parathyroid
adenoma given the clinical history.

The above is in keeping with the ACR TI-RADS recommendations - [HOSPITAL] 8644;[DATE].

## 2021-10-04 ENCOUNTER — Other Ambulatory Visit: Payer: Self-pay

## 2021-10-04 ENCOUNTER — Encounter: Payer: Self-pay | Admitting: Internal Medicine

## 2021-10-04 ENCOUNTER — Ambulatory Visit: Payer: Self-pay | Admitting: Internal Medicine

## 2021-10-04 VITALS — BP 118/78 | HR 64 | Resp 12 | Ht 69.5 in | Wt 215.0 lb

## 2021-10-04 DIAGNOSIS — Z6832 Body mass index (BMI) 32.0-32.9, adult: Secondary | ICD-10-CM

## 2021-10-04 DIAGNOSIS — G8929 Other chronic pain: Secondary | ICD-10-CM

## 2021-10-04 DIAGNOSIS — M546 Pain in thoracic spine: Secondary | ICD-10-CM

## 2021-10-04 DIAGNOSIS — R0989 Other specified symptoms and signs involving the circulatory and respiratory systems: Secondary | ICD-10-CM

## 2021-10-04 DIAGNOSIS — E213 Hyperparathyroidism, unspecified: Secondary | ICD-10-CM

## 2021-10-04 DIAGNOSIS — R7303 Prediabetes: Secondary | ICD-10-CM

## 2021-10-04 DIAGNOSIS — K219 Gastro-esophageal reflux disease without esophagitis: Secondary | ICD-10-CM

## 2021-10-04 DIAGNOSIS — E669 Obesity, unspecified: Secondary | ICD-10-CM

## 2021-10-04 DIAGNOSIS — Z0000 Encounter for general adult medical examination without abnormal findings: Secondary | ICD-10-CM

## 2021-10-04 LAB — POC COVID19 BINAXNOW: SARS Coronavirus 2 Ag: NEGATIVE

## 2021-10-04 MED ORDER — FAMOTIDINE 20 MG PO TABS: ORAL_TABLET | ORAL | 11 refills | Status: DC

## 2021-10-04 NOTE — Progress Notes (Signed)
Subjective:    Patient ID: Casey Mayo, female   DOB: 1976-10-18, 45 y.o.   MRN: [id number]   HPI  CPE with pap  1.  Pap:  Last pap smear was 06/2020 and normal reportedly with [facility], where IUD also placed.  Has an appt with them this month as well.    2.  Mammogram:  Last in 2019 and normal.  No family history of breast cancer.  Discussed risk and she would like to think about whether she wants to get a mammogram.    3.  Osteoprevention:  She is willing to drink 2% milk 3-4 servings daily.  She is doing exercises at home.  Mainly performs what PT gave her.    4.  Guaiac Cards:  Last performed December 2021 and negative for blood    5.  Colonoscopy:  Never.  No family history of colon cancer.    6.  Immunizations:  Cannot remember date of COVID booster.  Has not had bivalent by history.  Has not had influenza vaccine.  Immunization History  Administered Date(s) Administered   PFIZER(Purple Top)SARS-COV-2 Vaccination 01/14/2020, 02/04/2020   Tdap 04/23/2016     7.  Glucose/Cholesterol :  Cholesterol and A1C in normal ranges in March.    Current Meds  Medication Sig   acetaminophen (TYLENOL) 325 MG tablet Take 650 mg by mouth every 6 (six) hours as needed for mild pain. Reported on 04/21/2016   famotidine (PEPCID) 20 MG tablet Take 20 mg by mouth daily.   ibuprofen (ADVIL,MOTRIN) 200 MG tablet 2-3 tabs by mouth twice daily.   levonorgestrel (MIRENA) 20 MCG/24HR IUD 1 each by Intrauterine route once. Placed 06/2020 by PHD:  5 year   No Known Allergies  Past Medical History:  Diagnosis Date   GERD (gastroesophageal reflux disease)    Hyperparathyroidism 04/2019   Right inferior parathyroid adenoma   Past Surgical History:  Procedure Laterality Date   MASS EXCISION Right 04/04/2021   Procedure: EXCISION RIGHT PARATHYROID ADENOMA;  Surgeon: Abigail Miyamoto, MD;  Location: [facility];  Service: General;  Laterality: Right;   Family History  Problem Relation  Age of Onset   Heart disease Mother        Possibly CHF   Asthma Daughter    Asthma Son    Social History   Tobacco Use   Smoking status: Never    Passive exposure: Never   Smokeless tobacco: Never  Vaping Use   Vaping Use: Never used  Substance Use Topics   Alcohol use: No   Drug use: No     Review of Systems  Respiratory:  Negative for cough and shortness of breath.        Runny nose, sore throat for about 1 week.  No fever or dyspnea.  No myalgias.  She is also hoarse.  No fatigue.  Dayquil and Nyquil not helping, but Robitussin severe cold is helping.  Has Tylenol in it.  Has cool mist humidifier.    Cardiovascular:  Negative for chest pain, palpitations and leg swelling.  Gastrointestinal:        Acid reflux symptoms two to three times weekly.  Famotidine works well for her.  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  She states she can move the chair down so she is not holding her head in that manner.     Objective:   BP 118/78 (BP Location:  Right Arm, Patient Position: Sitting, Cuff Size: Normal)   Pulse 64   Resp 12   Ht 5' 9.5" (1.765 m)   Wt 215 lb (97.5 kg)   BMI 31.29 kg/m   Physical Exam HENT:     Head: Normocephalic and atraumatic.     Right Ear: Tympanic membrane, ear canal and external ear normal.     Left Ear: Tympanic membrane, ear canal and external ear normal.     Nose: Congestion and rhinorrhea (clear) present.     Mouth/Throat:     Mouth: Mucous membranes are moist.     Pharynx: Oropharynx is clear.  Eyes:     Extraocular Movements: Extraocular movements intact.     Conjunctiva/sclera: Conjunctivae normal.     Pupils: Pupils are equal, round, and reactive to light.     Comments: Discs sharp  Neck:     Thyroid: Thyromegaly (mild) present.  Cardiovascular:     Rate and Rhythm: Normal rate and regular rhythm.     Heart sounds: S1 normal and S2 normal. No murmur heard.    No friction rub. No  S3 or S4 sounds.     Comments: No carotid bruits.  Carotid, radial, femoral, DP and PT pulses normal and equal.   Pulmonary:     Effort: Pulmonary effort is normal.     Breath sounds: Normal breath sounds and air entry.  Chest:     Comments: Deferred to [facility] family planning clinic. Abdominal:     General: Bowel sounds are normal.     Palpations: Abdomen is soft. There is no hepatomegaly, splenomegaly or mass.     Tenderness: There is no abdominal tenderness.     Hernia: No hernia is present.  Genitourinary:    Comments: Deferred to [facility] Family planning clinic. Musculoskeletal:        General: Normal range of motion.     Cervical back: Normal range of motion and neck supple.     Right lower leg: No edema.     Left lower leg: No edema.  Lymphadenopathy:     Head:     Right side of head: No submental or submandibular adenopathy.     Left side of head: No submental or submandibular adenopathy.     Cervical: No cervical adenopathy.     Upper Body:     Right upper body: No supraclavicular or axillary adenopathy.     Left upper body: No supraclavicular or axillary adenopathy.     Lower Body: No right inguinal adenopathy. No left inguinal adenopathy.  Skin:    General: Skin is warm.     Capillary Refill: Capillary refill takes less than 2 seconds.     Findings: No rash.  Neurological:     General: No focal deficit present.     Mental Status: She is alert and oriented to person, place, and time.     Cranial Nerves: Cranial nerves 2-12 are intact.     Sensory: Sensation is intact.     Motor: Motor function is intact.     Coordination: Coordination is intact.     Gait: Gait is intact.     Deep Tendon Reflexes: Reflexes are normal and symmetric.  Psychiatric:        Mood and Affect: Mood normal.        Speech: Speech normal.        Behavior: Behavior normal. Behavior is cooperative.      Assessment & Plan    CPE without pap (  done at [facility]) Mammogram also with  [facility] Encouraged influenza and COVID vaccines--not interested today.  2.  Hx of prediabetes:  A1C  3.  Hx of hyperparathyroidism/hypercalcemia, S/P adenoma resection:  CMP.    4.  URI:  supportive care with OTC cold remedies, cool mist humidifier.  COVID negative.  5.  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  GERD:  Famotidine 40 mg at bedtime.  GERD precautions discussed.   7.  Obesity:  encouraged working on diet and daily physical activity to improve.

## 2021-10-05 LAB — CBC WITH DIFFERENTIAL/PLATELET
Basophils Absolute: 0 10*3/uL (ref 0.0–0.2)
Basos: 1 %
EOS (ABSOLUTE): 0.1 10*3/uL (ref 0.0–0.4)
Eos: 1 %
Hematocrit: 41.6 % (ref 34.0–46.6)
Hemoglobin: 14 g/dL (ref 11.1–15.9)
Immature Grans (Abs): 0 10*3/uL (ref 0.0–0.1)
Immature Granulocytes: 0 %
Lymphocytes Absolute: 2.9 10*3/uL (ref 0.7–3.1)
Lymphs: 46 %
MCH: 29.4 pg (ref 26.6–33.0)
MCHC: 33.7 g/dL (ref 31.5–35.7)
MCV: 87 fL (ref 79–97)
Monocytes Absolute: 0.3 10*3/uL (ref 0.1–0.9)
Monocytes: 5 %
Neutrophils Absolute: 3 10*3/uL (ref 1.4–7.0)
Neutrophils: 47 %
Platelets: 277 10*3/uL (ref 150–450)
RBC: 4.76 x10E6/uL (ref 3.77–5.28)
RDW: 12.4 % (ref 11.7–15.4)
WBC: 6.4 10*3/uL (ref 3.4–10.8)

## 2021-10-05 LAB — COMPREHENSIVE METABOLIC PANEL
ALT: 14 IU/L (ref 0–32)
AST: 11 IU/L (ref 0–40)
Albumin/Globulin Ratio: 1.8 (ref 1.2–2.2)
Albumin: 4.5 g/dL (ref 3.8–4.8)
Alkaline Phosphatase: 84 IU/L (ref 44–121)
BUN/Creatinine Ratio: 15 (ref 9–23)
BUN: 10 mg/dL (ref 6–24)
Bilirubin Total: 0.7 mg/dL (ref 0.0–1.2)
CO2: 24 mmol/L (ref 20–29)
Calcium: 9.6 mg/dL (ref 8.7–10.2)
Chloride: 106 mmol/L (ref 96–106)
Creatinine, Ser: 0.66 mg/dL (ref 0.57–1.00)
Globulin, Total: 2.5 g/dL (ref 1.5–4.5)
Glucose: 85 mg/dL (ref 70–99)
Potassium: 4.4 mmol/L (ref 3.5–5.2)
Sodium: 142 mmol/L (ref 134–144)
Total Protein: 7 g/dL (ref 6.0–8.5)
eGFR: 110 mL/min/{1.73_m2} (ref >59)

## 2021-10-05 LAB — HGB A1C W/O EAG: Hgb A1c MFr Bld: 5.9 % — ABNORMAL HIGH (ref 4.8–5.6)

## 2022-04-04 ENCOUNTER — Other Ambulatory Visit: Payer: Self-pay

## 2022-04-07 ENCOUNTER — Other Ambulatory Visit: Payer: Self-pay

## 2022-04-07 DIAGNOSIS — Z13220 Encounter for screening for lipoid disorders: Secondary | ICD-10-CM

## 2022-04-07 DIAGNOSIS — Z79899 Other long term (current) drug therapy: Secondary | ICD-10-CM

## 2022-04-07 DIAGNOSIS — R7309 Other abnormal glucose: Secondary | ICD-10-CM

## 2022-04-08 LAB — BASIC METABOLIC PANEL
BUN/Creatinine Ratio: 15 (ref 9–23)
BUN: 9 mg/dL (ref 6–24)
CO2: 22 mmol/L (ref 20–29)
Calcium: 9.4 mg/dL (ref 8.7–10.2)
Chloride: 104 mmol/L (ref 96–106)
Creatinine, Ser: 0.62 mg/dL (ref 0.57–1.00)
Glucose: 99 mg/dL (ref 70–99)
Potassium: 4.4 mmol/L (ref 3.5–5.2)
Sodium: 138 mmol/L (ref 134–144)
eGFR: 112 mL/min/{1.73_m2} (ref >59)

## 2022-04-08 LAB — LIPID PANEL W/O CHOL/HDL RATIO
Cholesterol, Total: 149 mg/dL (ref 100–199)
HDL: 45 mg/dL (ref >39)
LDL Chol Calc (NIH): 94 mg/dL (ref 0–99)
Triglycerides: 43 mg/dL (ref 0–149)
VLDL Cholesterol Cal: 10 mg/dL (ref 5–40)

## 2022-04-08 LAB — HEMOGLOBIN A1C
Est. average glucose Bld gHb Est-mCnc: 120 mg/dL
Hgb A1c MFr Bld: 5.8 % — ABNORMAL HIGH (ref 4.8–5.6)

## 2022-04-11 ENCOUNTER — Ambulatory Visit: Payer: Self-pay | Admitting: Internal Medicine

## 2022-04-11 ENCOUNTER — Encounter: Payer: Self-pay | Admitting: Internal Medicine

## 2022-04-11 VITALS — BP 138/66 | HR 80 | Resp 16 | Ht 69.5 in | Wt 216.0 lb

## 2022-04-11 DIAGNOSIS — Z6832 Body mass index (BMI) 32.0-32.9, adult: Secondary | ICD-10-CM

## 2022-04-11 DIAGNOSIS — E213 Hyperparathyroidism, unspecified: Secondary | ICD-10-CM

## 2022-04-11 DIAGNOSIS — R7303 Prediabetes: Secondary | ICD-10-CM

## 2022-04-11 DIAGNOSIS — M25561 Pain in right knee: Secondary | ICD-10-CM

## 2022-04-11 DIAGNOSIS — E669 Obesity, unspecified: Secondary | ICD-10-CM

## 2022-04-11 DIAGNOSIS — M542 Cervicalgia: Secondary | ICD-10-CM

## 2022-04-11 MED ORDER — DICLOFENAC SODIUM 1 % EX GEL: CUTANEOUS | Status: DC

## 2022-04-11 NOTE — Patient Instructions (Signed)
Neck stretches nightly--10 times each position after heating pad  Learn how to swim--recumbent bicycle.

## 2022-04-11 NOTE — Progress Notes (Signed)
Subjective:    Patient ID: Casey Mayo, female   DOB: 23-May-1976, 46 y.o.   MRN: [id number]   HPI   Prediabetes:  cut out all soda and not eating after 6 p.m.  She also walks 90 minutes in [place] daily.  Her A1C dropped 0.1 to 5.8% and her cholesterol also improved significantly   2.  Cholesterol:  Was fine previously, but much better with lifestyle changes.  Lipid Panel     Component Value Date/Time   CHOL 149 04/07/2022 0927   TRIG 43 04/07/2022 0927   HDL 45 04/07/2022 0927   CHOLHDL 3.0 Ratio 01/23/2010 2146   VLDL 6 01/23/2010 2146   LDLCALC 94 04/07/2022 0927   LABVLDL 10 04/07/2022 0927     3.  Right shoulder and upper back and neck with pain--years.  She has been to PT at [facility], but no improvement.  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  She is going to a chiropractor for treatment and states helps.  She would like to try topical diclofenac. No history of acute injury.    4.  Right knee pain worse than left knee pain.  She cannot walk up stairs due to pain in right knee.  Pain is mainly in posterior joint when going up stairs.  Feels like something "caught" in her knee.  Never with joint swelling.  No history of acute injury.  Chiropractor did perform Xrays--not clear what was seen, but was told something was pushed back into place--helped with pain.  Interested in Diclofenac gel.  Is also wearing a sleeve for her knee, which has helped.    5.  Financial:  owes money for her parathyroid surgery.  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   6.  History of parathyroid adenoma with hyperparathyroidism, s/p adenoma excision.  Calcium remains stable in normal range.  No muscle or diffuse joint aches.  No issues with urination.   Current Meds  Medication Sig   acetaminophen (TYLENOL) 325 MG tablet Take 650 mg by mouth every 6 (six) hours as needed for mild pain. Reported on 04/21/2016   famotidine (PEPCID) 20 MG tablet 2 tabs by  mouth once daily   ibuprofen (ADVIL,MOTRIN) 200 MG tablet 2-3 tabs by mouth twice daily.   levonorgestrel (MIRENA) 20 MCG/24HR IUD 1 each by Intrauterine route once. Placed 06/2020 by PHD:  5 year   No Known Allergies   Review of Systems    Objective:   BP 138/66 (BP Location: Left Arm, Patient Position: Sitting, Cuff Size: Normal)   Pulse 80   Resp 16   Ht 5' 9.5" (1.765 m)   Wt 216 lb (98 kg)   BMI 31.44 kg/m   Physical Exam NAD HEENT:  PERRL, EOMI Neck:  Supple, No adenopathy, no anterior neck or thyroid mass or tenderness.  Chest:  CTA CV:  RRR without murmur or rub  radial and DP pulses normal and equal MS:  Tender over right cervical paraspinous musculature and trap, upper thoracic back..  Right knee with full ROM, NT over entire joint line.  No laxity or pain on stress of cruciates or collateral ligaments.  No swelling or effusion.   Assessment & Plan    Prediabetes:  improved with lifestyle changes.  Continue.  2.   HM:  cholesterol improved with lifestyle changes.    3.  Neck/back pain:  continues with chiropractic care with temporary relief.  Referral to Ortho.  Xrays of spine..  Diclofenac gel to affected area up to 4 times daily as needed for pain.    4.  Right knee pain:  referral to ortho.  Xrays of knee.  Diclofenac gel as needed as above.    5.  HM:  declined Moderna bivalent for now.  6.  History of primary hyperparathyroidism:  Calcium remains at normal level.

## 2022-05-20 ENCOUNTER — Ambulatory Visit: Payer: No Typology Code available for payment source | Admitting: Physician Assistant

## 2022-05-26 ENCOUNTER — Ambulatory Visit (INDEPENDENT_AMBULATORY_CARE_PROVIDER_SITE_OTHER): Payer: Self-pay | Admitting: Physician Assistant

## 2022-05-26 ENCOUNTER — Ambulatory Visit (INDEPENDENT_AMBULATORY_CARE_PROVIDER_SITE_OTHER): Payer: Self-pay

## 2022-05-26 DIAGNOSIS — G8929 Other chronic pain: Secondary | ICD-10-CM

## 2022-05-26 DIAGNOSIS — M25511 Pain in right shoulder: Secondary | ICD-10-CM

## 2022-05-26 DIAGNOSIS — M25561 Pain in right knee: Secondary | ICD-10-CM

## 2022-05-26 MED ORDER — MELOXICAM 15 MG PO TABS: 15.0000 mg | ORAL_TABLET | Freq: Every day | ORAL | 1 refills | Status: DC

## 2022-05-26 NOTE — Progress Notes (Signed)
Office Visit Note   Patient: Casey Mayo           Date of Birth: 1976-06-24           MRN: [id number] Visit Date: 05/26/2022              Requested by: Mack Hook, MD [place],  [place] [address] PCP: Osborne Oman, MD  Chief Complaint  Patient presents with   Right Shoulder - Pain   Right Knee - Pain      HPI: Patient is a pleasant 46 year old woman who comes in today with 2 chief complaints.  First she complains of right knee pain that is been present since October.  She denies any injuries but relates an episode of going up some stairs and had immediate pain and had to have her help husband help her up the remaining stairs.  She has been using hot packs and oral ibuprofen.  She still continues to hurt.  Focuses the pain circumferentially around the knee and the back but she says this most hurts her when she is going up and down stairs.  Denies any significant swelling or effusion. Her second concern is her right shoulder.  She works Psychologist, occupational and she is right-hand dominant.  She has long periods of time where her arms up above her head.  At first she thought this was a neck issue but now she believes it is focal in the arm.  She does not have any radiation into her hand or paresthesias.  Again she is treated this with Motrin.  Assessment & Plan: Visit Diagnoses:  1. Chronic pain of right knee   2. Chronic right shoulder pain     Plan: Reviewed the x-rays and my findings with the patient today.  With regards to her right knee her exam was fairly benign today but with her description of having difficulty with stairs seems like it is more of an anterior knee pain.  We talked about trying some Voltaren gel as well as some physical therapy.  Could consider a cortisone injection in the future if this does not help. Findings in her right shoulder today were consistent with impingement.  Again discussed trying Voltaren gel as well as meloxicam instead of the  Motrin.  She like to do this but I told her not to take the 2 at the same time.  I do also think she would benefit from an injection today but she would like to first try therapy the Voltaren gel and the anti-inflammatory.  We will follow-up for both issues in a month  Follow-Up Instructions: No follow-ups on file.   Ortho Exam  Patient is alert, oriented, no adenopathy, well-dressed, normal affect, normal respiratory effort. Examination of her right shoulder she has full forward elevation.  She has pain with internal rotation behind her back.  She has a positive empty can test.  Strength is 5 out of 5.  Sensation is distally is intact.  Some tenderness to palpation over the [facility] joint has good range of motion of her neck does not reproduce any symptoms Right knee no effusion no warmth.  She can sustain a straight leg raise.  She has no particular area of tenderness no medial or lateral tenderness no tenderness palpable in the back cannot palpate a cyst.  Compartments of the lower leg are soft and nontender.  Imaging: XR KNEE 3 VIEW RIGHT  Result Date: 05/26/2022 Radiographs of her right knee were reviewed  today in multiple projections.  Well-maintained alignment overall well-preserved joint spaces with no significant arthritic changes.  XR Shoulder Right  Result Date: 05/26/2022 Radiographs of the right shoulder were reviewed in 3 projections today.  No evidence of any fracture or other osseous lesions.  Overall well-maintained congruency within the humeral glenoid interface.  She does have some hypertrophic bone and arthritic changes in the [facility] joint.  No images are attached to the encounter.  Labs: Lab Results  Component Value Date   HGBA1C 5.8 (H) 04/07/2022   HGBA1C 5.9 (H) 10/04/2021   HGBA1C 5.6 01/07/2021     Lab Results  Component Value Date   ALBUMIN 4.5 10/04/2021   ALBUMIN 4.5 01/07/2021   ALBUMIN 4.3 05/21/2020    No results found for: "MG" Lab Results  Component Value  Date   VD25OH 27 (L) 01/23/2010    No results found for: "PREALBUMIN"    Latest Ref Rng & Units 10/04/2021   11:46 AM 01/07/2021   10:30 AM 04/04/2019    9:53 AM  CBC EXTENDED  WBC 3.4 - 10.8 x10E3/uL 6.4  4.9  5.4   RBC 3.77 - 5.28 x10E6/uL 4.76  4.67  4.64   Hemoglobin 11.1 - 15.9 g/dL 14.0  14.3  13.7   HCT 34.0 - 46.6 % 41.6  42.5  41.8   Platelets 150 - 450 x10E3/uL 277  202  256   NEUT# 1.4 - 7.0 x10E3/uL 3.0  2.2  2.3   Lymph# 0.7 - 3.1 x10E3/uL 2.9  2.1  2.6      There is no height or weight on file to calculate BMI.  Orders:  Orders Placed This Encounter  Procedures   XR KNEE 3 VIEW RIGHT   XR Shoulder Right   Ambulatory referral to Physical Therapy   Ambulatory referral to Occupational Therapy   Meds ordered this encounter  Medications   meloxicam (MOBIC) 15 MG tablet    Sig: Take 1 tablet (15 mg total) by mouth daily.    Dispense:  30 tablet    Refill:  1     Procedures: No procedures performed  Clinical Data: No additional findings.  ROS:  All other systems negative, except as noted in the HPI. Review of Systems  Objective: Vital Signs: There were no vitals taken for this visit.  Specialty Comments:  No specialty comments available.  PMFS History: Patient Active Problem List   Diagnosis Date Noted   Class 1 obesity without serious comorbidity with body mass index (BMI) of 32.0 to 32.9 in adult 11/04/2020   Hyperparathyroidism ([place])    Chronic pain of left ankle 10/03/2020   Chronic bilateral low back pain with left-sided sciatica 10/03/2020   Hypercalcemia 01/03/2019   Neck pain 09/29/2018   Decreased visual acuity 09/29/2018   Foot cramps 09/29/2018   Gestational hypertension 04/21/2016   Pregnancy induced hypertension 10/28/2015   Past Medical History:  Diagnosis Date   GERD (gastroesophageal reflux disease)    Hyperparathyroidism ([place]) 04/2019   Right inferior parathyroid adenoma    Family History  Problem Relation Age of Onset    Heart disease Mother        Possibly CHF    Past Surgical History:  Procedure Laterality Date   MASS EXCISION Right 04/04/2021   Procedure: EXCISION RIGHT PARATHYROID ADENOMA;  Surgeon: Coralie Keens, MD;  Location: [facility];  Service: General;  Laterality: Right;   Social History   Occupational History   Occupation: Hair dresser  Tobacco Use  Smoking status: Never   Smokeless tobacco: Never  Vaping Use   Vaping Use: Never used  Substance and Sexual Activity   Alcohol use: No   Drug use: No   Sexual activity: Yes    Birth control/protection: I.U.D.    Comment: last intercourse Jul 09 2015

## 2022-05-28 ENCOUNTER — Ambulatory Visit: Payer: No Typology Code available for payment source | Attending: Physician Assistant

## 2022-05-28 DIAGNOSIS — G8929 Other chronic pain: Secondary | ICD-10-CM | POA: Insufficient documentation

## 2022-05-28 DIAGNOSIS — M25611 Stiffness of right shoulder, not elsewhere classified: Secondary | ICD-10-CM | POA: Insufficient documentation

## 2022-05-28 DIAGNOSIS — R262 Difficulty in walking, not elsewhere classified: Secondary | ICD-10-CM | POA: Insufficient documentation

## 2022-05-28 DIAGNOSIS — M25511 Pain in right shoulder: Secondary | ICD-10-CM | POA: Insufficient documentation

## 2022-05-28 DIAGNOSIS — M25561 Pain in right knee: Secondary | ICD-10-CM | POA: Insufficient documentation

## 2022-05-28 NOTE — Therapy (Signed)
OUTPATIENT PHYSICAL THERAPY LOWER EXTREMITY EVALUATION   Patient Name: Casey Mayo MRN: [id number] DOB:[birth year], 46 y.o., female Today's Date: 05/28/2022   PT End of Session - 05/28/22 0907     Visit Number 1    Number of Visits 1    Authorization Type self pay    PT Start Time [phone]    PT Stop Time 1007    PT Time Calculation (min) 51 min    Activity Tolerance Patient tolerated treatment well    Behavior During Therapy [facility] for tasks assessed/performed             Past Medical History:  Diagnosis Date   GERD (gastroesophageal reflux disease)    Hyperparathyroidism ([place]) 04/2019   Right inferior parathyroid adenoma   Past Surgical History:  Procedure Laterality Date   MASS EXCISION Right 04/04/2021   Procedure: EXCISION RIGHT PARATHYROID ADENOMA;  Surgeon: Coralie Keens, MD;  Location: [facility];  Service: General;  Laterality: Right;   Patient Active Problem List   Diagnosis Date Noted   Class 1 obesity without serious comorbidity with body mass index (BMI) of 32.0 to 32.9 in adult 11/04/2020   Hyperparathyroidism ([place])    Chronic pain of left ankle 10/03/2020   Chronic bilateral low back pain with left-sided sciatica 10/03/2020   Hypercalcemia 01/03/2019   Neck pain 09/29/2018   Decreased visual acuity 09/29/2018   Foot cramps 09/29/2018   Gestational hypertension 04/21/2016   Pregnancy induced hypertension 10/28/2015    PCP: Verita Schneiders, MD   REFERRING PROVIDER: Persons, Bevely Palmer, PA   REFERRING DIAG: [phone] (ICD-10-CM) - Chronic pain of right knee   THERAPY DIAG:  No diagnosis found.  Rationale for Evaluation and Treatment Rehabilitation  ONSET DATE: 05/26/22 referral; knee pain onset Oct 2022  SUBJECTIVE:   SUBJECTIVE STATEMENT: Patient reports doing well. States that her R knee will hurt when going up and down stairs. She states that if she walks for a long period of time it may hurt too. If she sits for too long her R foot will start  to cramp.   PERTINENT HISTORY: N/A  PAIN:  Are you having pain?  None right now, but when she does have pain- described as: achy. She wears a knee sleeve and uses Voltaren, which helps her pain.   PRECAUTIONS: Fall  WEIGHT BEARING RESTRICTIONS No  FALLS:  Has patient fallen in last 6 months? 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)  PLOF: Independent  PATIENT GOALS "I want to feel better"   OBJECTIVE:   DIAGNOSTIC FINDINGS:  R knee xray:  Well-maintained alignment overall well-preserved joint spaces with no  significant arthritic changes   PATIENT SURVEYS:  LEFS 63  COGNITION:  Overall cognitive status: Within functional limits for tasks assessed     SENSATION: WFL  EDEMA:  Patient reports intermittent swelling to R distal LE    POSTURE: No Significant postural limitations  PALPATION: No posterior cyst, no increased edema or pain to touch at joint line; elevated patella   LOWER EXTREMITY ROM: WFL B LE with no increase in pain with R knee AROM Active ROM Right eval Left eval  Hip flexion    Hip extension    Hip abduction    Hip adduction    Hip internal rotation    Hip external  rotation    Knee flexion    Knee extension    Ankle dorsiflexion    Ankle plantarflexion    Ankle inversion    Ankle eversion     (Blank rows = not tested)  LOWER EXTREMITY MMT:  MMT Right eval Left eval  Hip flexion 4 5  Hip extension    Hip abduction 5 5  Hip adduction 5   Hip internal rotation    Hip external rotation    Knee flexion 5 5  Knee extension 5 5  Ankle dorsiflexion 5 5  Ankle plantarflexion 5 5  Ankle inversion    Ankle eversion     (Blank rows = not tested)  LOWER EXTREMITY SPECIAL TESTS:  Knee special tests: Anterior drawer test:  negative, Posterior drawer test: negative, Patellafemoral grind test: positive , and Step up/down test: positive  Minimal B arch noted    GAIT: Distance walked: clinic Assistive device utilized: None Level of assistance: Complete Independence Comments: Slight R hip IR     TODAY'S TREATMENT: -Provided patient with HEP including self inferior patellar mobilizations -Ktape to support lowered position of patella and lateral stability (patient denies latex allergies or allergies to adhesives)    PATIENT EDUCATION:  Education details: OM results, HEP, PT, POC, skin protection related to ice/heat use and Ktape Person educated: Patient Education method: Explanation, Demonstration, and Handouts Education comprehension: verbalized understanding and returned demonstration   HOME EXERCISE PROGRAM: Access Code: [email or web address] URL: [email or web address] Date: 05/28/2022 Prepared by: Estevan Ryder  Exercises - Seated Inferior Patellar Glide  - 1 x daily - 7 x weekly - 3 sets - 10 reps - Supine Quad Set  - 1 x daily - 7 x weekly - 3 sets - 10 reps - Clamshell  - 1 x daily - 7 x weekly - 3 sets - 10 reps - Supine Bridge  - 1 x daily - 7 x weekly - 3 sets - 10 reps - Standing Heel Raise with Support  - 1 x daily - 7 x weekly - 3 sets - 10 reps - Supine Active Straight Leg Raise  - 1 x daily - 7 x weekly - 3 sets - 10 reps  ASSESSMENT:  CLINICAL IMPRESSION: Patient is a 46 y.o. female who was seen today for physical therapy evaluation and treatment for anterior knee pain. She demonstrates patella alta with resultant knee pain and decreased ability to engage R quad in pain-free manner. She scored a 63 on the LEFS indicating minimal disability due to her knee pain. PT providing patient with basic HEP to assist with quad and hip strengthening. Patient may return if these measures do not assist with her knee pain.    PERSONAL FACTORS Fitness, Past/current experiences, and Time since  onset of injury/illness/exacerbation are also affecting patient's functional outcome.   REHAB POTENTIAL: Good  CLINICAL DECISION MAKING: Stable/uncomplicated  EVALUATION COMPLEXITY: Low   GOALS: Goals reviewed with patient? Yes  No goals set at this time.    PLAN: PT FREQUENCY: one time visit  PT DURATION: other: 1 time visit      Debbora Dus, PT Debbora Dus, PT, DPT, CBIS  05/28/2022, 10:16 AM

## 2022-06-12 ENCOUNTER — Ambulatory Visit: Payer: No Typology Code available for payment source | Admitting: Occupational Therapy

## 2022-06-16 ENCOUNTER — Encounter: Payer: Self-pay | Admitting: *Deleted

## 2022-06-16 ENCOUNTER — Ambulatory Visit: Payer: No Typology Code available for payment source | Admitting: Occupational Therapy

## 2022-06-16 DIAGNOSIS — M25611 Stiffness of right shoulder, not elsewhere classified: Secondary | ICD-10-CM

## 2022-06-16 DIAGNOSIS — G8929 Other chronic pain: Secondary | ICD-10-CM

## 2022-06-16 NOTE — Therapy (Signed)
OUTPATIENT OCCUPATIONAL THERAPY ORTHO EVALUATION  Patient Name: Casey Mayo MRN: [id number] DOB:[birth year], 46 y.o., female Today's Date: 06/16/2022  Requested by: Mack Hook, MD [place],  [place] [address] PCP: Osborne Oman, MD Referred by: Persons, Bevely Palmer, PA   OT End of Session - 06/16/22 1234     Visit Number 1    Number of Visits 10    Authorization Type Self Pay    OT Start Time 1016    OT Stop Time 1105    OT Time Calculation (min) 49 min    Activity Tolerance Patient tolerated treatment well;No increased pain    Behavior During Therapy [facility] for tasks assessed/performed             Past Medical History:  Diagnosis Date   GERD (gastroesophageal reflux disease)    Hyperparathyroidism ([place]) 04/2019   Right inferior parathyroid adenoma   Past Surgical History:  Procedure Laterality Date   MASS EXCISION Right 04/04/2021   Procedure: EXCISION RIGHT PARATHYROID ADENOMA;  Surgeon: Coralie Keens, MD;  Location: [facility];  Service: General;  Laterality: Right;   Patient Active Problem List   Diagnosis Date Noted   Class 1 obesity without serious comorbidity with body mass index (BMI) of 32.0 to 32.9 in adult 11/04/2020   Hyperparathyroidism ([place])    Chronic pain of left ankle 10/03/2020   Chronic bilateral low back pain with left-sided sciatica 10/03/2020   Hypercalcemia 01/03/2019   Neck pain 09/29/2018   Decreased visual acuity 09/29/2018   Foot cramps 09/29/2018   Gestational hypertension 04/21/2016   Pregnancy induced hypertension 10/28/2015    ONSET DATE: 05/26/2022 Referral date  REFERRING DIAG: M25.511,G89.29 (ICD-10-CM) - Chronic right shoulder pain Note:   Right shoulder rotator cuff tendonitis  THERAPY DIAG:  Right shoulder pain chronic  Rationale for Evaluation and Treatment Rehabilitation  SUBJECTIVE:   SUBJECTIVE STATEMENT: Pt reports having right shoulder pain for about 3 years now. 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 She was referred today per her PA and will f/u after therapy PRN. Pt accompanied by: self  PERTINENT HISTORY: Pt with chronic R shoulder pain due to braiding hair. Imaging: Radiographs of the right shoulder were reviewed in 3 projections by her PA/MD on 05/26/22.    Findings: No evidence of any fracture or other osseous lesions. Overall well-maintained congruency within the humeral glenoid interface. She does have some hypertrophic bone and arthritic changes in the [facility] joint.  PRECAUTIONS: None  WEIGHT BEARING RESTRICTIONS No  PAIN:  Are you having pain? Yes: NPRS scale: 4/10 Pain location: right lateral neck/traps and shoulder/RTC  Pain description: cramping Aggravating factors: braiding hair, keeping hands overhead for extended periods of time, pain is worse at night Relieving factors: taking anti-inflammatory that MD gave her, massage, resting/not braiding  FALLS: Has patient fallen in last 6 months?  Pt reports 1 fall, falling upstairs about 6 months ago  LIVING ENVIRONMENT: Lives with: lives with their family (husband and 2 children ages 76,11 y/o. Lives in: House/apartment Stairs: Yes: Internal: bedroom on second floor steps; on right going up Has following equipment at home: N/A  PLOF: Independent  PATIENT GOALS Decrease pain  OBJECTIVE:   HAND DOMINANCE: Right  ADLs: Overall ADLs: Independent Transfers/ambulation related to ADLs: Eating: Independent Grooming: Independent UB Dressing: Independent LB Dressing: Independent Toileting: Independent Bathing: Independent Tub Shower transfers: Independent Equipment: none   UPPER EXTREMITY ROM     Active ROM Right eval  Left eval  Shoulder flexion WFL   Shoulder abduction WFL   Shoulder adduction WFL   Shoulder extension WFL c/o soreness   Shoulder internal rotation Limited; impaired "feels heavy:"   Shoulder external rotation Limited; impaired "feels heavy"   Elbow flexion  WNL   Elbow extension WNL   Wrist flexion WNL   Wrist extension WNL   Wrist ulnar deviation WNL   Wrist radial deviation NT   Wrist pronation NT   Wrist supination NT   (Blank rows = not tested)   UPPER EXTREMITY Special Tests:    Provocative testing:  Right shoulder has full forward elevation.  She has pain with and is limited in internal rotation behind her back.  She has a positive empty can test, Positive for pain with Jobe's test. Strength is overall 5 out of 5 throughout R UE.  Pt is positive/with tenderness to palpation over the [facility] joint, has good range of motion of her neck. She is positive for soreness in her traps on the right.    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  She has long periods of time where her arms up above her head (anywhere from 0-9 hours at a time).  At first she thought this was a neck issue but now she believes it is her neck and shoulder. She does not have any radiation or parasthesias into her hand. No symptoms on the left. Again she has treated this in the past with Motrin and recently anti-inflammatory medication that was prescribed by her PA.  TODAY'S TREATMENT:    Pt was educated in shoulder impingement, tendinitis vs RTC tears using diagram of the shoulder joint. Findings and recommendations were reviewed with pt verbally in the clinic today.   She was instructed in attempting to adapt her technique of hair braiding and the sustained position of keeping her arms extended overhead at about 110-120*. Pt stated that she can have clients sit in a chair and lower it so she is at ~90* instead. This was encouraged. She was also encouraged to have family help with overhead lifting tasks  (reaching up onto shelves at grocery stores, in the kitchen etc.). Examples were provided and reviewed verbally in the clinic.    Pt was educated in a HEP program to include rotator cuff stretching and ROM/stabilization exercises. HEP includes lying supine (supporting her right elbow on a pillow w/ forearm at 90* and performing IR/ER within comfort/pain free);  quadraped (Angry Cat stretch with focus on shoulder joint stabilization and opening up the glenohumeral joint followed by sitting back on knees); Lying prone on elbows and performing "windshield wipers" exercise within pain free active ROM; and wall stretch (ulnar side of forearms on the wall with simultaneous chin tuck and shoulder shrug). Pt required min vc's for positioning and maintaining chin tuck or "double chin". Pt was given a paper handout and also referred to YouTube video of Dr Shanon Brow "Top 5 Shoulder Impingement Exercises To Help Get Healthy Again" for exercises that were not in her handout. Pt was noted to copy/forward this link to her daughter so she could refer to it at home PRN. Pt was educated to perform HEP 5x/day x10-15 pain free reps each. She verbalized understanding of all of  the above.   PATIENT EDUCATION: Education details: Positioning, recommendations etc (see above note for details) Person educated: Patient Education method: Explanation, Demonstration, Verbal cues, Handout, written/verbal, demonstration  Education comprehension: verbalized understanding, verbal cues required, and needs further education   HOME EXERCISE PROGRAM: See above for R UE HEP   GOALS: Goals reviewed with patient? Yes   SHORT TERM GOALS: (STG required if POC>30 days) Target: (07/14/22)  Pt will be Mod I initial home program R UE/rotator cuff Target date: 07/14/22 Goal status: Initial  2.  Pt will report decreased pain in R dominant UE as seen by pain rating of 3/10 or less with Active range of motion for internal and external  rotation. Target date: 07/14/22 Goal status: INITIAL   LONG TERM GOALS:    1. Pt will be Mod I updated HEP R UE  Target date: 08/11/2022  Goal status: INITIAL  2.  Pt will improve A/ROM in IR/ER from Impaired to at least WFL's, to have functional motion for tasks such as reach and grasp (not overhead).  Target date: 08/11/22 Goal status: INITIAL    3.  Pt will be Mod I for simulated/adapted work related tasks to have increased functional ability to carry out selfcare and higher-level homecare/work tasks without difficulty. Target date: 08/11/2022 Goal status: INITIAL    4.  Pt will report decrease pain at worst from 4/10 to 2/10 or better to have better sleep and occupational participation in daily roles. Target date: 11/11/2021 Goal status: INITIAL   ASSESSMENT:  CLINICAL IMPRESSION: Patient is a 46 y.o. female who was seen today for occupational therapy evaluation secondary to a h/o R shoulder and neck pain that has been ongoing for the last 3 years. She braids hair for a living and has been diagnosed with R shoulder impingement/RTC tendonitis. She works with her arms outstretched and overhead to ~110-120* for anywhere from 0-9 hours per day when working. Her pain is worse at night. She should benefit from pt education, HEP, range of motion, modalities, adaptive techniques for job related tasks and eventual strengthening of her rotator cuff and R UE. She was instructed in an initial HEP today and should benefit from review and upgrading of this program at her next visit.  PERFORMANCE DEFICITS in functional skills including ROM, pain, and flexibility.  IMPAIRMENTS are limiting patient from rest and sleep and work.   COMORBIDITIES may have co-morbidities  that affects occupational performance. Patient will benefit from skilled OT to address above impairments and improve overall function.  MODIFICATION OR ASSISTANCE TO COMPLETE EVALUATION: No modification of tasks or assist necessary  to complete an evaluation.  OT OCCUPATIONAL PROFILE AND HISTORY: Problem focused assessment: Including review of records relating to presenting problem.  CLINICAL DECISION MAKING: LOW - limited treatment options, no task modification necessary  REHAB POTENTIAL: Fair Due to length of injury >3 years and job requirements  EVALUATION COMPLEXITY: Moderate      PLAN: OT FREQUENCY: 1-2x/week every 2 weeks  OT DURATION: 8 weeks  PLANNED INTERVENTIONS: self care/ADL training, therapeutic exercise, therapeutic activity, manual therapy, passive range of motion, ultrasound, iontophoresis, moist heat, cryotherapy, patient/family education, energy conservation, and DME and/or AE instructions  RECOMMENDED OTHER SERVICES: F/u with MD PRN  CONSULTED AND AGREED WITH PLAN OF CARE: Patient  PLAN FOR NEXT SESSION: Review HEP and upgrade as appropriate.   Percell Miller [place], OT 06/16/2022, 12:36 PM

## 2022-07-07 ENCOUNTER — Encounter: Payer: No Typology Code available for payment source | Admitting: Occupational Therapy

## 2022-10-09 ENCOUNTER — Ambulatory Visit (INDEPENDENT_AMBULATORY_CARE_PROVIDER_SITE_OTHER): Payer: Self-pay | Admitting: Internal Medicine

## 2022-10-09 ENCOUNTER — Encounter: Payer: Self-pay | Admitting: Internal Medicine

## 2022-10-09 VITALS — BP 130/82 | HR 68 | Resp 20 | Ht 70.0 in | Wt 214.0 lb

## 2022-10-09 DIAGNOSIS — R5383 Other fatigue: Secondary | ICD-10-CM

## 2022-10-09 DIAGNOSIS — R7303 Prediabetes: Secondary | ICD-10-CM

## 2022-10-09 DIAGNOSIS — Z0000 Encounter for general adult medical examination without abnormal findings: Secondary | ICD-10-CM

## 2022-10-09 DIAGNOSIS — M7541 Impingement syndrome of right shoulder: Secondary | ICD-10-CM

## 2022-10-09 DIAGNOSIS — Z1231 Encounter for screening mammogram for malignant neoplasm of breast: Secondary | ICD-10-CM

## 2022-10-09 NOTE — Progress Notes (Signed)
Subjective:    Patient ID: Casey Mayo, female   DOB: 02-Jan-1976, 46 y.o.   MRN: [id number]   HPI  CPE without pap  1.  Pap: Last pap at [facility] in 2022 and normal.  Always normal.    2.  Mammogram:  Last mammogram in 2019 and normal.  No family history of breast cancer.    3.  Osteoprevention:  Drinks 2 cups whole milk daily.  Not physically active.  Just tired.  States she isn't having anything going on, just is lazy.    4.  Guaiac Cards/FIT:  Last in 2021 and negative.    5.  Colonoscopy:  Never.  No family history of colon cancer.    6.  Immunizations:  Had one COVID booster for a total of 3 vaccines.  Has never had influenza vaccine. Immunization History  Administered Date(s) Administered   PFIZER(Purple Top)SARS-COV-2 Vaccination 01/14/2020, 02/04/2020   Tdap 04/23/2016     7.  Glucose/Cholesterol:  Prediabetes with A1C of 5.8% in June.  Cholesterol panel was fine. Lipid Panel     Component Value Date/Time   CHOL 149 04/07/2022 0927   TRIG 43 04/07/2022 0927   HDL 45 04/07/2022 0927   CHOLHDL 3.0 Ratio 01/23/2010 2146   VLDL 6 01/23/2010 2146   LDLCALC 94 04/07/2022 0927   LABVLDL 10 04/07/2022 0927      Current Meds  Medication Sig   acetaminophen (TYLENOL) 325 MG tablet Take 650 mg by mouth every 6 (six) hours as needed for mild pain. Reported on 04/21/2016   diclofenac Sodium (VOLTAREN) 1 % GEL Apply to affected areas up to 4 times daily as needed.   famotidine (PEPCID) 20 MG tablet 2 tabs by mouth once daily   levonorgestrel (MIRENA) 20 MCG/24HR IUD 1 each by Intrauterine route once. Placed 06/2020 by PHD:  5 year   meloxicam (MOBIC) 15 MG tablet Take 1 tablet (15 mg total) by mouth daily.   No Known Allergies  Past Medical History:  Diagnosis Date   GERD (gastroesophageal reflux disease)    Hyperparathyroidism ([place]) 04/2019   Right inferior parathyroid adenoma   Past Surgical History:  Procedure Laterality Date   MASS EXCISION Right 04/04/2021    Procedure: EXCISION RIGHT PARATHYROID ADENOMA;  Surgeon: Coralie Keens, MD;  Location: [facility];  Service: General;  Laterality: Right;   Family History  Problem Relation Age of Onset   Heart disease Mother        Possibly CHF   Asthma Daughter    Asthma Son    Social History   Socioeconomic History   Marital status: Married    Spouse name: Ibra Virgin   Number of children: 4   Years of education: 5   Highest education level: Not on file  Occupational History   Occupation: Hair dresser  Tobacco Use   Smoking status: Never    Passive exposure: Never   Smokeless tobacco: Never  Vaping Use   Vaping Use: Never used  Substance and Sexual Activity   Alcohol use: No   Drug use: No   Sexual activity: Yes    Birth control/protection: I.U.D.    Comment: last intercourse Jul 09 2015  Other Topics Concern   Not on file  Social History Narrative   Lives at home with husband, 3 youngest children   80 yo daughter still lives in [place].   Social Determinants of Health   Financial Resource Strain: Low Risk  (10/09/2022)   Overall Financial  Resource Strain (CARDIA)    Difficulty of Paying Living Expenses: Not hard at all  Food Insecurity: No Food Insecurity (10/09/2022)   Hunger Vital Sign    Worried About Running Out of Food in the Last Year: Never true    Ran Out of Food in the Last Year: Never true  Transportation Needs: No Transportation Needs (10/09/2022)   PRAPARE - Hydrologist (Medical): No    Lack of Transportation (Non-Medical): No  Physical Activity: Insufficiently Active (04/04/2019)   Exercise Vital Sign    Days of Exercise per Week: 7 days    Minutes of Exercise per Session: 20 min  Stress: No Stress Concern Present (09/29/2018)   [place]    Feeling of Stress : Only a little  Social Connections: Moderately Integrated (09/29/2018)   Social Connection and Isolation  Panel [NHANES]    Frequency of Communication with Friends and Family: More than three times a week    Frequency of Social Gatherings with Friends and Family: More than three times a week    Attends Religious Services: More than 4 times per year    Active Member of [employer] or Organizations: No    Attends Archivist Meetings: Never    Marital Status: Married  Human resources officer Violence: Not At Risk (10/04/2021)   Humiliation, Afraid, Rape, and Kick questionnaire    Fear of Current or Ex-Partner: No    Emotionally Abused: No    Physically Abused: No    Sexually Abused: No      Review of Systems  Respiratory:  Negative for shortness of breath.   Cardiovascular:  Negative for chest pain, palpitations and leg swelling.  Gastrointestinal:  Negative for abdominal pain and blood in stool (No melena).  Genitourinary:  Negative for vaginal discharge.  Neurological:  Negative for weakness and numbness.  Psychiatric/Behavioral:  Negative for dysphoric mood. The patient is not nervous/anxious.       Objective:   BP 130/82 (BP Location: Left Arm, Patient Position: Sitting, Cuff Size: Normal)   Pulse 68   Resp 20   Ht '5\' 10"'$  (1.778 m)   Wt 214 lb (97.1 kg)   BMI 30.71 kg/m   Physical Exam HENT:     Head: Normocephalic and atraumatic.     Right Ear: Tympanic membrane, ear canal and external ear normal.     Left Ear: Tympanic membrane, ear canal and external ear normal.     Nose: Nose normal.     Mouth/Throat:     Mouth: Mucous membranes are moist.     Pharynx: Oropharynx is clear.  Eyes:     Extraocular Movements: Extraocular movements intact.     Conjunctiva/sclera: Conjunctivae normal.     Pupils: Pupils are equal, round, and reactive to light.     Comments: Discs sharp bilaterally  Neck:     Thyroid: Thyromegaly (mild) present. No thyroid mass.  Cardiovascular:     Rate and Rhythm: Normal rate and regular rhythm.     Heart sounds: S1 normal and S2 normal. No murmur  heard.    No friction rub. No S3 or S4 sounds.     Comments: No carotid bruits.  Carotid, radial, femoral, DP and PT pulses normal and equal.   Pulmonary:     Effort: Pulmonary effort is normal.     Breath sounds: Normal breath sounds and air entry.  Chest:     Comments: Deferred as  performed yearly at [facility] Abdominal:     General: Bowel sounds are normal.     Palpations: Abdomen is soft. There is no hepatomegaly, splenomegaly or mass.     Tenderness: There is no abdominal tenderness.     Hernia: No hernia is present.  Genitourinary:    Comments: Deferred as performed yearly at [facility].   Musculoskeletal:     Right shoulder: No tenderness or crepitus. Decreased range of motion (Decreased full abduction and flexion due to pain.  NT over subacromial, AC, CC joints.).     Left shoulder: No tenderness or crepitus. Normal range of motion.     Cervical back: Normal range of motion and neck supple.     Right lower leg: No edema.     Left lower leg: No edema.  Lymphadenopathy:     Head:     Right side of head: No submental or submandibular adenopathy.     Left side of head: No submental or submandibular adenopathy.     Cervical: No cervical adenopathy.     Upper Body:     Right upper body: No supraclavicular adenopathy.     Left upper body: No supraclavicular adenopathy.     Lower Body: No right inguinal adenopathy. No left inguinal adenopathy.  Skin:    General: Skin is warm.     Capillary Refill: Capillary refill takes less than 2 seconds.     Findings: No rash.  Neurological:     Mental Status: She is alert and oriented to person, place, and time.     Cranial Nerves: Cranial nerves 2-12 are intact.     Sensory: Sensation is intact.     Motor: Motor function is intact.     Coordination: Coordination is intact.     Gait: Gait is intact.     Deep Tendon Reflexes: Reflexes are normal and symmetric.  Psychiatric:        Speech: Speech normal.        Behavior: Behavior normal.  Behavior is cooperative.      Assessment & Plan    CPE without pap. List for any incoming influenza vaccine. Will think about COVID booster. FIT to return in 2 weeks. Mammogram GU exam at [facility].  2.  Fatigue:  CBC, CMP, TSH  3.  Prediabetes:  A1C  4.  Impingement, right shoulder with continued pain despite Meloxicam or topical Diclofenac.  Referral back to ortho for injection.

## 2022-10-13 ENCOUNTER — Encounter: Payer: Self-pay | Admitting: Physician Assistant

## 2022-10-13 ENCOUNTER — Ambulatory Visit (INDEPENDENT_AMBULATORY_CARE_PROVIDER_SITE_OTHER): Payer: Self-pay | Admitting: Physician Assistant

## 2022-10-13 ENCOUNTER — Ambulatory Visit (INDEPENDENT_AMBULATORY_CARE_PROVIDER_SITE_OTHER): Payer: Self-pay

## 2022-10-13 DIAGNOSIS — M25511 Pain in right shoulder: Secondary | ICD-10-CM

## 2022-10-13 DIAGNOSIS — G8929 Other chronic pain: Secondary | ICD-10-CM

## 2022-10-13 MED ORDER — METHYLPREDNISOLONE ACETATE 40 MG/ML IJ SUSP
80.0000 mg | INTRAMUSCULAR | Status: AC | PRN
  Administered 2022-10-13: 80 mg via INTRA_ARTICULAR

## 2022-10-13 MED ORDER — LIDOCAINE HCL 1 % IJ SOLN
2.0000 mL | INTRAMUSCULAR | Status: AC | PRN
  Administered 2022-10-13: 2 mL

## 2022-10-13 MED ORDER — BUPIVACAINE HCL 0.25 % IJ SOLN
2.0000 mL | INTRAMUSCULAR | Status: AC | PRN
  Administered 2022-10-13: 2 mL via INTRA_ARTICULAR

## 2022-10-13 NOTE — Progress Notes (Addendum)
Office Visit Note   Patient: Casey Mayo           Date of Birth: 1976/07/07           MRN: [id number] Visit Date: 10/13/2022              Requested by: Mack Hook, MD [place],  [place] [address] PCP: Mack Hook, MD  Chief Complaint  Patient presents with  . Right Shoulder - Pain      HPI: Patient is a pleasant 46 year old woman who I have seen in the past for right shoulder pain.  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  I saw her in July.  At that time she elected conservative management.  She denies any reinjury.  She denies any neck pain.  Assessment & Plan: Visit Diagnoses:  1. Chronic right shoulder pain     Plan: Findings consistent with rotator cuff tendinitis we talked about the natural history of this.  She will try an injection of cortisone today.  I told her to give this a couple weeks.  She will contact us if she does not improve would recommend an MRI of her shoulder  Follow-Up Instructions: Return if symptoms worsen or fail to improve.   Ortho Exam  Patient is alert, oriented, no adenopathy, well-dressed, normal affect, normal respiratory effort. Right shoulder she has full forward elevation some decreased internal rotation behind her back.  She has a positive empty can test mildly positive speeds test.  Strength is 5 out of 5 with resisted abduction external/internal rotation no neck pain or stiffness  Imaging: XR Shoulder Right  Result Date: 10/13/2022 Radiographs of her right shoulder were reviewed today.  She has some hypertrophic bone formation and degenerative changes at the [facility] joint.  Overall well-maintained spacing between the humeral head and the glenoid fossa.  No acute fractures  No images are attached to the encounter.  Labs: Lab Results  Component Value Date   HGBA1C 5.8 (H) 04/07/2022   HGBA1C 5.9 (H) 10/04/2021   HGBA1C 5.6 01/07/2021     Lab Results   Component Value Date   ALBUMIN 4.5 10/04/2021   ALBUMIN 4.5 01/07/2021   ALBUMIN 4.3 05/21/2020    No results found for: "MG" Lab Results  Component Value Date   VD25OH 27 (L) 01/23/2010    No results found for: "PREALBUMIN"    Latest Ref Rng & Units 10/04/2021   11:46 AM 01/07/2021   10:30 AM 04/04/2019    9:53 AM  CBC EXTENDED  WBC 3.4 - 10.8 x10E3/uL 6.4  4.9  5.4   RBC 3.77 - 5.28 x10E6/uL 4.76  4.67  4.64   Hemoglobin 11.1 - 15.9 g/dL 14.0  14.3  13.7   HCT 34.0 - 46.6 % 41.6  42.5  41.8   Platelets 150 - 450 x10E3/uL 277  202  256   NEUT# 1.4 - 7.0 x10E3/uL 3.0  2.2  2.3   Lymph# 0.7 - 3.1 x10E3/uL 2.9  2.1  2.6      There is no height or weight on file to calculate BMI.  Orders:  Orders Placed This Encounter  Procedures  . XR Shoulder Right   No orders of the defined types were placed in this encounter.    Procedures: Large Joint Inj: R subacromial bursa on 10/13/2022 11:20 AM Indications: diagnostic evaluation and pain Details: 25 G 1.5 in needle, posterior approach  Arthrogram: No  Medications: 2 mL lidocaine 1 %; 80 mg methylPREDNISolone acetate 40 MG/ML; 2 mL bupivacaine 0.25 % Outcome: tolerated well, no immediate complications Procedure, treatment alternatives, risks and benefits explained, specific risks discussed. Consent was given by the patient.    Clinical Data: No additional findings.  ROS:  All other systems negative, except as noted in the HPI. Review of Systems  Objective: Vital Signs: There were no vitals taken for this visit.  Specialty Comments:  No specialty comments available.  PMFS History: Patient Active Problem List   Diagnosis Date Noted  . Pain in right shoulder 10/13/2022  . Class 1 obesity without serious comorbidity with body mass index (BMI) of 32.0 to 32.9 in adult 11/04/2020  . Hyperparathyroidism ([place])   . Chronic pain of left ankle 10/03/2020  . Chronic bilateral low back pain with left-sided sciatica  10/03/2020  . Hypercalcemia 01/03/2019  . Neck pain 09/29/2018  . Decreased visual acuity 09/29/2018  . Foot cramps 09/29/2018  . Gestational hypertension 04/21/2016  . Pregnancy induced hypertension 10/28/2015   Past Medical History:  Diagnosis Date  . GERD (gastroesophageal reflux disease)   . Hyperparathyroidism ([place]) 04/2019   Right inferior parathyroid adenoma    Family History  Problem Relation Age of Onset  . Heart disease Mother        Possibly CHF  . Asthma Daughter   . Asthma Son     Past Surgical History:  Procedure Laterality Date  . MASS EXCISION Right 04/04/2021   Procedure: EXCISION RIGHT PARATHYROID ADENOMA;  Surgeon: Coralie Keens, MD;  Location: [facility];  Service: General;  Laterality: Right;   Social History   Occupational History  . Occupation: Hair dresser  Tobacco Use  . Smoking status: Never    Passive exposure: Never  . Smokeless tobacco: Never  Vaping Use  . Vaping Use: Never used  Substance and Sexual Activity  . Alcohol use: No  . Drug use: No  . Sexual activity: Yes    Birth control/protection: I.U.D.    Comment: last intercourse Jul 09 2015

## 2022-10-14 LAB — COMPREHENSIVE METABOLIC PANEL
ALT: 19 IU/L (ref 0–32)
AST: 19 IU/L (ref 0–40)
Albumin/Globulin Ratio: 1.9 (ref 1.2–2.2)
Albumin: 4.1 g/dL (ref 3.9–4.9)
Alkaline Phosphatase: 63 IU/L (ref 44–121)
BUN/Creatinine Ratio: 11 (ref 9–23)
BUN: 7 mg/dL (ref 6–24)
Bilirubin Total: 1.1 mg/dL (ref 0.0–1.2)
CO2: 23 mmol/L (ref 20–29)
Calcium: 9.4 mg/dL (ref 8.7–10.2)
Chloride: 102 mmol/L (ref 96–106)
Creatinine, Ser: 0.64 mg/dL (ref 0.57–1.00)
Globulin, Total: 2.2 g/dL (ref 1.5–4.5)
Glucose: 93 mg/dL (ref 70–99)
Potassium: 4.3 mmol/L (ref 3.5–5.2)
Sodium: 138 mmol/L (ref 134–144)
Total Protein: 6.3 g/dL (ref 6.0–8.5)
eGFR: 110 mL/min/{1.73_m2} (ref >59)

## 2022-10-14 LAB — CBC WITH DIFFERENTIAL/PLATELET
Basophils Absolute: 0 10*3/uL (ref 0.0–0.2)
Basos: 0 %
EOS (ABSOLUTE): 0.2 10*3/uL (ref 0.0–0.4)
Eos: 3 %
Hematocrit: 39.3 % (ref 34.0–46.6)
Hemoglobin: 12.9 g/dL (ref 11.1–15.9)
Immature Grans (Abs): 0 10*3/uL (ref 0.0–0.1)
Immature Granulocytes: 0 %
Lymphocytes Absolute: 2.4 10*3/uL (ref 0.7–3.1)
Lymphs: 54 %
MCH: 29.5 pg (ref 26.6–33.0)
MCHC: 32.8 g/dL (ref 31.5–35.7)
MCV: 90 fL (ref 79–97)
Monocytes Absolute: 0.4 10*3/uL (ref 0.1–0.9)
Monocytes: 8 %
Neutrophils Absolute: 1.6 10*3/uL (ref 1.4–7.0)
Neutrophils: 35 %
Platelets: 234 10*3/uL (ref 150–450)
RBC: 4.38 x10E6/uL (ref 3.77–5.28)
RDW: 11.9 % (ref 11.7–15.4)
WBC: 4.5 10*3/uL (ref 3.4–10.8)

## 2022-10-14 LAB — HGB A1C W/O EAG: Hgb A1c MFr Bld: 6 % — ABNORMAL HIGH (ref 4.8–5.6)

## 2022-10-14 LAB — TSH: TSH: 0.809 u[IU]/mL (ref 0.450–4.500)

## 2022-10-16 ENCOUNTER — Other Ambulatory Visit: Payer: Self-pay | Admitting: Internal Medicine

## 2022-10-27 DIAGNOSIS — J387 Other diseases of larynx: Secondary | ICD-10-CM

## 2022-10-27 DIAGNOSIS — Q396 Congenital diverticulum of esophagus: Secondary | ICD-10-CM

## 2022-10-27 DIAGNOSIS — K449 Diaphragmatic hernia without obstruction or gangrene: Secondary | ICD-10-CM | POA: Insufficient documentation

## 2022-10-27 DIAGNOSIS — R490 Dysphonia: Secondary | ICD-10-CM

## 2022-10-27 HISTORY — DX: Other diseases of larynx: J38.7

## 2022-10-27 HISTORY — DX: Congenital diverticulum of esophagus: Q39.6

## 2022-10-27 HISTORY — DX: Diaphragmatic hernia without obstruction or gangrene: K44.9

## 2022-10-27 HISTORY — DX: Dysphonia: R49.0

## 2023-01-07 ENCOUNTER — Telehealth: Payer: Self-pay

## 2023-01-07 NOTE — Telephone Encounter (Signed)
Patient called to request an eye referral through [facility] for a general check up

## 2023-01-19 ENCOUNTER — Telehealth: Payer: Self-pay

## 2023-01-19 NOTE — Telephone Encounter (Signed)
Patient would like an appointment to discuss her heart burn. Patient describes heartburn is more frequent and pepcid is not helping her symptoms

## 2023-01-27 NOTE — Telephone Encounter (Signed)
Patient has been scheduled

## 2023-01-29 ENCOUNTER — Encounter: Payer: Self-pay | Admitting: Internal Medicine

## 2023-01-29 ENCOUNTER — Ambulatory Visit: Payer: Self-pay | Admitting: Internal Medicine

## 2023-01-29 VITALS — BP 120/70 | HR 60 | Resp 16 | Ht 70.0 in | Wt 211.0 lb

## 2023-01-29 DIAGNOSIS — H547 Unspecified visual loss: Secondary | ICD-10-CM

## 2023-01-29 DIAGNOSIS — K219 Gastro-esophageal reflux disease without esophagitis: Secondary | ICD-10-CM

## 2023-01-29 NOTE — Progress Notes (Signed)
    Subjective:    Patient ID: Casey Mayo, female   DOB: [birth year], 47 y.o.   MRN: [id number]   HPI   GERD:  Since about 2 weeks ago with Ramadan, she started having more symptoms.  Previously, she had decreased the Famotidine to once to twice weekly, whenever she at dinner late more for preventive measures than symptoms of GERD.  States she is having a burning pain in substernal area after eating.  Similar to pain she had with GERD symptoms previously. Started taking the Famotidine 20 mg once daily about 12 hour before her meal, which has helped She drinks a lot of black coffee. Eating onions. Has HOB elevated, but only with pillows.    2.  Decreased visual acuity:  needing both near and far vision.  Current Meds  Medication Sig   acetaminophen (TYLENOL) 325 MG tablet Take 650 mg by mouth every 6 (six) hours as needed for mild pain. Reported on 04/21/2016   diclofenac Sodium (VOLTAREN) 1 % GEL Apply to affected areas up to 4 times daily as needed.   famotidine (PEPCID) 20 MG tablet Take 2 tablets by mouth once daily   levonorgestrel (MIRENA) 20 MCG/24HR IUD 1 each by Intrauterine route once. Placed 06/2020 by PHD:  5 year   No Known Allergies   Review of Systems    Objective:   BP 120/70 (BP Location: Right Arm, Patient Position: Sitting, Cuff Size: Normal)   Pulse 60   Resp 16   Ht 5\' 10"  (1.778 m)   Wt 211 lb (95.7 kg)   BMI 30.28 kg/m   Physical Exam NAD Neck:  Supple, No adenopathy Chest:  CTA CV:  RRR with grade 1/6 SEM.  Radial and DP pulses normal and equal Abd:  Mild epigastric tenderness.  No HSM or mass, + BS LE:  No edema Mild epigastric tenderness  Assessment & Plan   GERD:  Elevated HOB at feet of HOB rather than with pillows.  Hold onions and coffee.  Avoid lying down for 2 hours after eating, even during Ramadan.  Increase Famotidine to 40 mg daily.  Call Monday if no improvement and will switch for short period to PPI.    2.  Decreased visual  acuity:  referral to optometry

## 2023-01-29 NOTE — Patient Instructions (Signed)
Call on Monday if no improvement taking Famotidine 40 mg (2 tabs together) once daily on empty stomach.

## 2023-02-02 NOTE — Telephone Encounter (Signed)
This was discussed during patients recent appointment

## 2023-02-09 DIAGNOSIS — G8929 Other chronic pain: Secondary | ICD-10-CM | POA: Insufficient documentation

## 2023-02-09 DIAGNOSIS — R7303 Prediabetes: Secondary | ICD-10-CM | POA: Insufficient documentation

## 2023-02-09 DIAGNOSIS — K219 Gastro-esophageal reflux disease without esophagitis: Secondary | ICD-10-CM | POA: Insufficient documentation

## 2023-02-09 DIAGNOSIS — M25561 Pain in right knee: Secondary | ICD-10-CM | POA: Insufficient documentation

## 2023-03-02 ENCOUNTER — Telehealth: Payer: Self-pay

## 2023-03-02 ENCOUNTER — Other Ambulatory Visit: Payer: Self-pay

## 2023-03-02 MED ORDER — OMEPRAZOLE 20 MG PO CPDR: DELAYED_RELEASE_CAPSULE | ORAL | 3 refills | Status: DC

## 2023-03-02 NOTE — Telephone Encounter (Signed)
Patient has been taken Famotidine and does not feel like is helping, she still feels pain on her chest from the heartburn , patient wants to know if she can take the antacid at the same time with the famotidine.

## 2023-03-04 ENCOUNTER — Encounter (HOSPITAL_COMMUNITY): Payer: Self-pay

## 2023-03-04 ENCOUNTER — Ambulatory Visit (HOSPITAL_COMMUNITY)
Admission: EM | Admit: 2023-03-04 | Discharge: 2023-03-04 | Disposition: A | Discharge status: Home / Self Care | Payer: No Typology Code available for payment source | Attending: Internal Medicine | Admitting: Internal Medicine

## 2023-03-04 DIAGNOSIS — J302 Other seasonal allergic rhinitis: Secondary | ICD-10-CM | POA: Insufficient documentation

## 2023-03-04 DIAGNOSIS — N3001 Acute cystitis with hematuria: Secondary | ICD-10-CM | POA: Insufficient documentation

## 2023-03-04 LAB — POCT URINALYSIS DIP (MANUAL ENTRY)
Bilirubin, UA: NEGATIVE
Glucose, UA: NEGATIVE mg/dL
Ketones, POC UA: NEGATIVE mg/dL
Nitrite, UA: NEGATIVE
Protein Ur, POC: NEGATIVE mg/dL
Spec Grav, UA: 1.015 (ref 1.010–1.025)
Urobilinogen, UA: 0.2 E.U./dL
pH, UA: 8.5 — AB (ref 5.0–8.0)

## 2023-03-04 MED ORDER — FLUTICASONE PROPIONATE 50 MCG/ACT NA SUSP: 1.0000 | Freq: Every day | NASAL | 0 refills | Status: DC

## 2023-03-04 MED ORDER — LEVOCETIRIZINE DIHYDROCHLORIDE 5 MG PO TABS: 5.0000 mg | ORAL_TABLET | Freq: Every evening | ORAL | 0 refills | Status: DC

## 2023-03-04 MED ORDER — CEPHALEXIN 500 MG PO CAPS: 500.0000 mg | ORAL_CAPSULE | Freq: Two times a day (BID) | ORAL | 0 refills | Status: DC

## 2023-03-04 NOTE — ED Provider Notes (Signed)
[facility]    CSN: [id number] Arrival date & time: 03/04/23  0844      History   Chief Complaint Chief Complaint  Patient presents with   Urinary Frequency   Cough   Nasal Congestion   chest congestion   Sore Throat    HPI Casey Mayo is a 48 y.o. female.   Patient presents today with several concerns.  Her primary concern today is a several week history of URI symptoms including nasal congestion, sinus pressure, scratchy throat, hoarse voice.  She denies any cough, chest pain, shortness of breath, fever.  Denies any known sick contacts.  Denies history of seasonal allergies but has been taking over-the-counter antihistamines as well as Alka-Seltzer cold and flu and using Vicks with temporary improvement of symptoms.  She denies history of smoking, asthma, COPD.  She does have a history of prediabetes but denies formal diagnosis of diabetes.  Denies any recent antibiotics or steroids.  In addition, she reports a 3-day history of urinary frequency and urgency.  Denies any dysuria, abdominal pain, pelvic pain, fever, nausea, vomiting.  She is confident that she is not pregnant as she has an IUD.  She denies any history of diabetes and does not take SGLT2 inhibitor.  She has not tried any over-the-counter medication to manage her symptoms.  Denies any recent antibiotics.  Denies any recent urogenital procedure or catheterization.  Denies history of nephrolithiasis.    Past Medical History:  Diagnosis Date   GERD (gastroesophageal reflux disease)    Hyperparathyroidism (HCC) 04/2019   Right inferior parathyroid adenoma    Patient Active Problem List   Diagnosis Date Noted   Prediabetes 02/09/2023   Chronic bilateral thoracic back pain 02/09/2023   Gastroesophageal reflux disease 02/09/2023   Right knee pain 02/09/2023   Pain in right shoulder 10/13/2022   Class 1 obesity without serious comorbidity with body mass index (BMI) of 32.0 to 32.9 in adult 11/04/2020    Hyperparathyroidism (HCC)    Chronic pain of left ankle 10/03/2020   Chronic bilateral low back pain with left-sided sciatica 10/03/2020   Hypercalcemia 01/03/2019   Neck pain 09/29/2018   Decreased visual acuity 09/29/2018   Foot cramps 09/29/2018   Gestational hypertension 04/21/2016   Pregnancy induced hypertension 10/28/2015    Past Surgical History:  Procedure Laterality Date   MASS EXCISION Right 04/04/2021   Procedure: EXCISION RIGHT PARATHYROID ADENOMA;  Surgeon: Abigail Miyamoto, MD;  Location: [facility];  Service: General;  Laterality: Right;    OB History     Gravida  6   Para  4   Term  4   Preterm      AB  2   Living  1      SAB      IAB      Ectopic      Multiple  0   Live Births  1            Home Medications    Prior to Admission medications   Medication Sig Start Date End Date Taking? Authorizing Provider  cephALEXin (KEFLEX) 500 MG capsule Take 1 capsule (500 mg total) by mouth 2 (two) times daily. 03/04/23  Yes Melchizedek Espinola K, PA-C  fluticasone (FLONASE) 50 MCG/ACT nasal spray Place 1 spray into both nostrils daily. 03/04/23  Yes Ewald Beg, Denny Peon K, PA-C  levocetirizine (XYZAL ALLERGY 24HR) 5 MG tablet Take 1 tablet (5 mg total) by mouth every evening. 03/04/23  Yes Shanyia Stines, Noberto Retort,  PA-C  omeprazole (PRILOSEC) 20 MG capsule Take 1 capsule (20mg  total) on an empty stomach half hour before breakfast by mouth daily 03/02/23   Julieanne Manson, MD  acetaminophen (TYLENOL) 325 MG tablet Take 650 mg by mouth every 6 (six) hours as needed for mild pain. Reported on 04/21/2016    [provider]  diclofenac Sodium (VOLTAREN) 1 % GEL Apply to affected areas up to 4 times daily as needed. 04/11/22   Julieanne Manson, MD  famotidine (PEPCID) 20 MG tablet Take 2 tablets by mouth once daily 10/17/22   Julieanne Manson, MD  levonorgestrel (MIRENA) 20 MCG/24HR IUD 1 each by Intrauterine route once. Placed 06/2020 by PHD:  5 year    [provider]  meloxicam (MOBIC) 15 MG tablet Take 1 tablet (15 mg total) by mouth daily. Patient not taking: Reported on 01/29/2023 05/26/22   Persons, [place], [place]    Family History Family History  Problem Relation Age of Onset   Heart disease Mother        Possibly CHF   Asthma Daughter    Asthma Son     Social History Social History   Tobacco Use   Smoking status: Never    Passive exposure: Never   Smokeless tobacco: Never  Vaping Use   Vaping Use: Never used  Substance Use Topics   Alcohol use: No   Drug use: No     Allergies   Patient has no known allergies.   Review of Systems Review of Systems  Constitutional:  Negative for activity change, appetite change, fatigue and fever.  HENT:  Positive for congestion, postnasal drip, sinus pressure and voice change. Negative for ear pain, sneezing and sore throat.   Respiratory:  Negative for cough and shortness of breath.   Cardiovascular:  Negative for chest pain.  Gastrointestinal:  Negative for abdominal pain, diarrhea, nausea and vomiting.  Genitourinary:  Positive for frequency and urgency. Negative for dysuria, vaginal bleeding, vaginal discharge and vaginal pain.  Neurological:  Negative for dizziness, light-headedness and headaches.     Physical Exam Triage Vital Signs ED Triage Vitals  Enc Vitals Group     BP 03/04/23 0924 (!) 144/82     Pulse Rate 03/04/23 0924 66     Resp 03/04/23 0924 16     Temp 03/04/23 0924 98.5 F (36.9 C)     Temp Source 03/04/23 0924 Oral     SpO2 03/04/23 0924 96 %     Weight --      Height --      Head Circumference --      Peak Flow --      Pain Score 03/04/23 0925 0     Pain Loc --      Pain Edu? --      Excl. in GC? --    No data found.  Updated Vital Signs BP (!) 144/82 (BP Location: Left Arm)   Pulse 66   Temp 98.5 F (36.9 C) (Oral)   Resp 16   SpO2 96%   Visual Acuity Right Eye Distance:   Left Eye Distance:   Bilateral Distance:    Right Eye Near:   Left  Eye Near:    Bilateral Near:     Physical Exam Vitals reviewed.  Constitutional:      General: She is awake. She is not in acute distress.    Appearance: Normal appearance. She is well-developed. She is not ill-appearing.     Comments: Very  pleasant female appears stated age in no acute distress sitting comfortably in exam room  HENT:     Head: Normocephalic and atraumatic.     Right Ear: Ear canal and external ear normal. A middle ear effusion is present. Tympanic membrane is not erythematous or bulging.     Left Ear: Ear canal and external ear normal. A middle ear effusion is present. Tympanic membrane is not erythematous or bulging.     Nose: Congestion present.     Right Sinus: No maxillary sinus tenderness or frontal sinus tenderness.     Left Sinus: No maxillary sinus tenderness or frontal sinus tenderness.     Mouth/Throat:     Pharynx: Uvula midline. Posterior oropharyngeal erythema present. No oropharyngeal exudate.     Comments: Erythema and drainage in posterior oropharynx Cardiovascular:     Rate and Rhythm: Normal rate and regular rhythm.     Heart sounds: No murmur heard. Pulmonary:     Effort: Pulmonary effort is normal.     Breath sounds: Normal breath sounds. No wheezing, rhonchi or rales.     Comments: Clear to auscultation bilaterally Abdominal:     General: Bowel sounds are normal.     Palpations: Abdomen is soft.     Tenderness: There is no abdominal tenderness. There is no right CVA tenderness, left CVA tenderness, guarding or rebound.  Musculoskeletal:     Right lower leg: No edema.     Left lower leg: No edema.  Psychiatric:        Behavior: Behavior is cooperative.      UC Treatments / Results  Labs (all labs ordered are listed, but only abnormal results are displayed) Labs Reviewed  POCT URINALYSIS DIP (MANUAL ENTRY) - Abnormal; Notable for the following components:      Result Value   Blood, UA trace-lysed (*)    pH, UA 8.5 (*)    Leukocytes,  UA Trace (*)    All other components within normal limits  URINE CULTURE    EKG   Radiology No results found.  Procedures Procedures (including critical care time)  Medications Ordered in UC Medications - No data to display  Initial Impression / Assessment and Plan / UC Course  I have reviewed the triage vital signs and the nursing notes.  Pertinent labs & imaging results that were available during my care of the patient were reviewed by me and considered in my medical decision making (see chart for details).     Patient is well-appearing, afebrile, nontoxic, nontachycardic.  No evidence of acute infection on physical exam that warrant initiation of antibiotics.  Suspect seasonal allergies as etiology of symptoms.  Will start levocetirizine at night.  Will also start fluticasone to help with congestion.  Recommended nasal saline and sinus rinses.  She can use over-the-counter Mucinex for additional symptom relief.  She has been treated for urinary tract infection and Keflex was chosen and we discussed that this would also cover for sinus symptoms.  Discussed that if her symptoms or not improving or if anything is changing she needs to be reevaluated.  UA did have trace blood and leukocyte esterase.  Given she is symptomatic we will send this for culture but also initiate antibiotic therapy with cephalexin 500 mg twice daily for 5 days.  She was encouraged to rest and drink plenty of fluid.  We will contact her if need to change or discontinue antibiotics based on culture results.  Discussed that if she has any worsening  or changing symptoms she should return for reevaluation including abdominal pain, pelvic pain, fever, nausea, vomiting.  Strict return precautions given.  Work excuse note provided.  Final Clinical Impressions(s) / UC Diagnoses   Final diagnoses:  Seasonal allergies  Acute cystitis with hematuria     Discharge Instructions      I believe that your congestion  symptoms are related to seasonal allergies.  Please start Xyzal at night.  This will make you sleepy so take it right before bed.  I recommend nasal saline and sinus rinses.  Use fluticasone 1 spray in each nostril daily to help with your congestion symptoms.  Make sure that you are resting and drinking plenty of fluid.  You can use over-the-counter medications such as Mucinex for additional symptom relief.  If your symptoms or not improving please return for reevaluation.  Your urine did have some blood and white blood cells which can be sign of infection.  Given you are symptomatic we will treat with an antibiotic.  Take cephalexin twice daily for 5 days.  We will send her urine for culture and contact you if need to change or alter your antibiotics.  Make sure that you rest and drink plenty of fluids.  If your symptoms change and you have abdominal pain, fever, pelvic pain, blood in your urine, nausea/vomiting interfering with oral intake you should be seen immediately.     ED Prescriptions     Medication Sig Dispense Auth. Provider   levocetirizine (XYZAL ALLERGY 24HR) 5 MG tablet Take 1 tablet (5 mg total) by mouth every evening. 30 tablet Zander Ingham K, PA-C   fluticasone (FLONASE) 50 MCG/ACT nasal spray Place 1 spray into both nostrils daily. 16 g Shantrell Placzek K, PA-C   cephALEXin (KEFLEX) 500 MG capsule Take 1 capsule (500 mg total) by mouth 2 (two) times daily. 10 capsule Rochele Lueck, Noberto Retort, PA-C      PDMP not reviewed this encounter.   Jeani Hawking, PA-C 03/04/23 1007

## 2023-03-04 NOTE — ED Triage Notes (Signed)
Patient c/o sore throat, nasal and chest congestion, a non productive cough more than 10 days.  Patient also c/o urinary frequency x 3 days.

## 2023-03-04 NOTE — Discharge Instructions (Signed)
I believe that your congestion symptoms are related to seasonal allergies.  Please start Xyzal at night.  This will make you sleepy so take it right before bed.  I recommend nasal saline and sinus rinses.  Use fluticasone 1 spray in each nostril daily to help with your congestion symptoms.  Make sure that you are resting and drinking plenty of fluid.  You can use over-the-counter medications such as Mucinex for additional symptom relief.  If your symptoms or not improving please return for reevaluation.  Your urine did have some blood and white blood cells which can be sign of infection.  Given you are symptomatic we will treat with an antibiotic.  Take cephalexin twice daily for 5 days.  We will send her urine for culture and contact you if need to change or alter your antibiotics.  Make sure that you rest and drink plenty of fluids.  If your symptoms change and you have abdominal pain, fever, pelvic pain, blood in your urine, nausea/vomiting interfering with oral intake you should be seen immediately.

## 2023-03-05 ENCOUNTER — Telehealth: Payer: Self-pay

## 2023-03-05 LAB — URINE CULTURE: Culture: <10000 — AB

## 2023-03-05 NOTE — Telephone Encounter (Signed)
Telephoned patient at mobile number. Patient will call back and complete mammogram scholarship screening process.

## 2023-03-09 ENCOUNTER — Encounter (HOSPITAL_COMMUNITY): Payer: Self-pay

## 2023-03-09 ENCOUNTER — Ambulatory Visit (HOSPITAL_COMMUNITY)
Admission: EM | Admit: 2023-03-09 | Discharge: 2023-03-09 | Disposition: A | Discharge status: Home / Self Care | Payer: No Typology Code available for payment source | Attending: Emergency Medicine | Admitting: Emergency Medicine

## 2023-03-09 DIAGNOSIS — K219 Gastro-esophageal reflux disease without esophagitis: Secondary | ICD-10-CM

## 2023-03-09 MED ORDER — FAMOTIDINE 40 MG PO TABS: 40.0000 mg | ORAL_TABLET | Freq: Every day | ORAL | 2 refills | Status: DC

## 2023-03-09 NOTE — ED Provider Notes (Signed)
[facility]    CSN: [id number] Arrival date & time: 03/09/23  0809     History   Chief Complaint Chief Complaint  Patient presents with   Heartburn   Diarrhea    HPI Casey Mayo is a 47 y.o. female.  2 day history of reflux. Reports substernal burning worse with eating and laying flat. History of reflux and reports this is similar. She was started on omeprazole on a week ago by her PCP. Was doing well until 2 days ago. She eats her normal diet including spicy, greasy, and caffeine.   She tried an OTC antacid liquid that gave her loose stools.   No fever, nausea, vomiting, abdominal pain, chest pain, shortness of breath. Episode is not worse than her usual   Past Medical History:  Diagnosis Date   GERD (gastroesophageal reflux disease)    Hyperparathyroidism (HCC) 04/2019   Right inferior parathyroid adenoma    Patient Active Problem List   Diagnosis Date Noted   Prediabetes 02/09/2023   Chronic bilateral thoracic back pain 02/09/2023   Gastroesophageal reflux disease 02/09/2023   Right knee pain 02/09/2023   Pain in right shoulder 10/13/2022   Class 1 obesity without serious comorbidity with body mass index (BMI) of 32.0 to 32.9 in adult 11/04/2020   Hyperparathyroidism (HCC)    Chronic pain of left ankle 10/03/2020   Chronic bilateral low back pain with left-sided sciatica 10/03/2020   Hypercalcemia 01/03/2019   Neck pain 09/29/2018   Decreased visual acuity 09/29/2018   Foot cramps 09/29/2018   Gestational hypertension 04/21/2016   Pregnancy induced hypertension 10/28/2015    Past Surgical History:  Procedure Laterality Date   MASS EXCISION Right 04/04/2021   Procedure: EXCISION RIGHT PARATHYROID ADENOMA;  Surgeon: Abigail Miyamoto, MD;  Location: [facility];  Service: General;  Laterality: Right;    OB History     Gravida  6   Para  4   Term  4   Preterm      AB  2   Living  1      SAB      IAB      Ectopic      Multiple  0    Live Births  1            Home Medications    Prior to Admission medications   Medication Sig Start Date End Date Taking? Authorizing Provider  acetaminophen (TYLENOL) 325 MG tablet Take 650 mg by mouth every 6 (six) hours as needed for mild pain. Reported on 04/21/2016   Yes [provider]  diclofenac Sodium (VOLTAREN) 1 % GEL Apply to affected areas up to 4 times daily as needed. 04/11/22  Yes Julieanne Manson, MD  famotidine (PEPCID) 40 MG tablet Take 1 tablet (40 mg total) by mouth daily. 03/09/23  Yes Mckenze Slone, Lurena Joiner, PA-C  fluticasone (FLONASE) 50 MCG/ACT nasal spray Place 1 spray into both nostrils daily. 03/04/23  Yes Raspet, Denny Peon K, PA-C  levocetirizine (XYZAL ALLERGY 24HR) 5 MG tablet Take 1 tablet (5 mg total) by mouth every evening. 03/04/23  Yes Raspet, Noberto Retort, PA-C  levonorgestrel (MIRENA) 20 MCG/24HR IUD 1 each by Intrauterine route once. Placed 06/2020 by PHD:  5 year   Yes [provider]  omeprazole (PRILOSEC) 20 MG capsule Take 1 capsule (20mg  total) on an empty stomach half hour before breakfast by mouth daily 03/02/23  Yes Julieanne Manson, MD    Family History Family History  Problem Relation Age  of Onset   Heart disease Mother        Possibly CHF   Asthma Daughter    Asthma Son     Social History Social History   Tobacco Use   Smoking status: Never    Passive exposure: Never   Smokeless tobacco: Never  Vaping Use   Vaping Use: Never used  Substance Use Topics   Alcohol use: No   Drug use: No     Allergies   Patient has no known allergies.   Review of Systems Review of Systems As per HPI  Physical Exam Triage Vital Signs ED Triage Vitals [03/09/23 0856]  Enc Vitals Group     BP (!) 141/86     Pulse Rate 72     Resp 16     Temp 98 F (36.7 C)     Temp Source Oral     SpO2 96 %     Weight      Height      Head Circumference      Peak Flow      Pain Score      Pain Loc      Pain Edu?      Excl. in GC?    No  data found.  Updated Vital Signs BP (!) 141/86 (BP Location: Left Arm)   Pulse 72   Temp 98 F (36.7 C) (Oral)   Resp 16   SpO2 96%    Physical Exam Vitals and nursing note reviewed.  Constitutional:      General: She is not in acute distress.    Appearance: She is not ill-appearing.  HENT:     Mouth/Throat:     Mouth: Mucous membranes are moist.     Pharynx: Oropharynx is clear.  Eyes:     Conjunctiva/sclera: Conjunctivae normal.  Cardiovascular:     Rate and Rhythm: Normal rate and regular rhythm.     Heart sounds: Normal heart sounds.  Pulmonary:     Effort: Pulmonary effort is normal.     Breath sounds: Normal breath sounds.  Chest:     Chest wall: No tenderness.  Abdominal:     General: Bowel sounds are normal. There is no distension.     Palpations: Abdomen is soft.     Tenderness: There is no abdominal tenderness. There is no guarding or rebound.  Musculoskeletal:        General: Normal range of motion.     Cervical back: Normal range of motion.  Skin:    General: Skin is warm and dry.  Neurological:     Mental Status: She is alert and oriented to person, place, and time.      UC Treatments / Results  Labs (all labs ordered are listed, but only abnormal results are displayed) Labs Reviewed - No data to display  EKG  Radiology No results found.  Procedures Procedures (including critical care time)  Medications Ordered in UC Medications - No data to display  Initial Impression / Assessment and Plan / UC Course  I have reviewed the triage vital signs and the nursing notes.  Pertinent labs & imaging results that were available during my care of the patient were reviewed by me and considered in my medical decision making (see chart for details).  Afebrile and well-appearing She will continue taking the omeprazole once every day. Also recommend starting Pepcid once daily.  We discussed diet changes that could help to relieve her symptoms.   Recommend she  call her primary care for follow-up as she likely needs to see a GI specialist. No red flags at this time Strict return and ED precautions discussed.  Additionally she was seen 5/8 for possible UTI.  Was started on Keflex and has only taken 2 days so far. The culture returned with no growth.  We have discontinued this medication today.  Final Clinical Impressions(s) / UC Diagnoses   Final diagnoses:  Gastroesophageal reflux disease without esophagitis     Discharge Instructions      Continue to take the OMEPRAZOLE once every day, before eating  The PEPCID can be taken once daily (before or after eating)  Please avoid spicy, citrus, fatty, greasy foods. Caffeine and alcohol can also make your symptoms worse  If at any point your symptoms worsen, please go to the Emergency Department (hospital)  Call your primary care provider for follow up.    ED Prescriptions     Medication Sig Dispense Auth. Provider   famotidine (PEPCID) 40 MG tablet Take 1 tablet (40 mg total) by mouth daily. 30 tablet Demiyah Fischbach, Lurena Joiner, PA-C      PDMP not reviewed this encounter.   Shericka Johnstone, Lurena Joiner, [place] 03/09/23 1558

## 2023-03-09 NOTE — ED Triage Notes (Signed)
Pt c/o diarrhea x3 days. Pt reports heartburn x 2 days.Pt had several loose stools this morning. Pt took OTC medication for heartburn this morning.

## 2023-03-09 NOTE — Discharge Instructions (Addendum)
Continue to take the OMEPRAZOLE once every day, before eating  The PEPCID can be taken once daily (before or after eating)  Please avoid spicy, citrus, fatty, greasy foods. Caffeine and alcohol can also make your symptoms worse  If at any point your symptoms worsen, please go to the Emergency Department (hospital)  Call your primary care provider for follow up.

## 2023-03-10 ENCOUNTER — Emergency Department (HOSPITAL_COMMUNITY): Payer: No Typology Code available for payment source

## 2023-03-10 ENCOUNTER — Emergency Department (HOSPITAL_COMMUNITY)
Admission: EM | Admit: 2023-03-10 | Discharge: 2023-03-10 | Disposition: A | Discharge status: Home / Self Care | Payer: No Typology Code available for payment source | Attending: Emergency Medicine | Admitting: Emergency Medicine

## 2023-03-10 ENCOUNTER — Telehealth: Payer: Self-pay

## 2023-03-10 DIAGNOSIS — K219 Gastro-esophageal reflux disease without esophagitis: Secondary | ICD-10-CM

## 2023-03-10 DIAGNOSIS — K529 Noninfective gastroenteritis and colitis, unspecified: Secondary | ICD-10-CM | POA: Insufficient documentation

## 2023-03-10 LAB — CBC WITH DIFFERENTIAL/PLATELET
Abs Immature Granulocytes: 0.03 10*3/uL (ref 0.00–0.07)
Basophils Absolute: 0 10*3/uL (ref 0.0–0.1)
Basophils Relative: 0 %
Eosinophils Absolute: 0.1 10*3/uL (ref 0.0–0.5)
Eosinophils Relative: 1 %
HCT: 41 % (ref 36.0–46.0)
Hemoglobin: 13.3 g/dL (ref 12.0–15.0)
Immature Granulocytes: 0 %
Lymphocytes Relative: 26 %
Lymphs Abs: 2.3 10*3/uL (ref 0.7–4.0)
MCH: 29.1 pg (ref 26.0–34.0)
MCHC: 32.4 g/dL (ref 30.0–36.0)
MCV: 89.7 fL (ref 80.0–100.0)
Monocytes Absolute: 0.7 10*3/uL (ref 0.1–1.0)
Monocytes Relative: 8 %
Neutro Abs: 5.8 10*3/uL (ref 1.7–7.7)
Neutrophils Relative %: 65 %
Platelets: 270 10*3/uL (ref 150–400)
RBC: 4.57 MIL/uL (ref 3.87–5.11)
RDW: 12.3 % (ref 11.5–15.5)
WBC: 9 10*3/uL (ref 4.0–10.5)
nRBC: 0 % (ref 0.0–0.2)

## 2023-03-10 LAB — BASIC METABOLIC PANEL
Anion gap: 8 (ref 5–15)
BUN: 8 mg/dL (ref 6–20)
CO2: 24 mmol/L (ref 22–32)
Calcium: 9.2 mg/dL (ref 8.9–10.3)
Chloride: 102 mmol/L (ref 98–111)
Creatinine, Ser: 0.74 mg/dL (ref 0.44–1.00)
GFR, Estimated: >60 mL/min (ref >60)
Glucose, Bld: 102 mg/dL — ABNORMAL HIGH (ref 70–99)
Potassium: 3.7 mmol/L (ref 3.5–5.1)
Sodium: 134 mmol/L — ABNORMAL LOW (ref 135–145)

## 2023-03-10 LAB — TROPONIN I (HIGH SENSITIVITY): Troponin I (High Sensitivity): 2 ng/L (ref <18)

## 2023-03-10 MED ORDER — OMEPRAZOLE 20 MG PO CPDR: 40.0000 mg | DELAYED_RELEASE_CAPSULE | Freq: Every day | ORAL | 0 refills | Status: DC

## 2023-03-10 MED ORDER — ALUM & MAG HYDROXIDE-SIMETH 200-200-20 MG/5ML PO SUSP
30.0000 mL | Freq: Once | ORAL | Status: AC
  Administered 2023-03-10: 30 mL via ORAL
  Filled 2023-03-10: qty 30

## 2023-03-10 NOTE — ED Triage Notes (Signed)
C/o heartburn for more then one week. Has been seen by [facility] and PCP, medication adjustments with minimal improvement. Requesting a [place] to determine cause.

## 2023-03-10 NOTE — Discharge Instructions (Signed)
Your workup today is overall reassuring.  No concerning cause of your symptoms.  Likely uncontrolled heartburn.  I have sent in omeprazole 40 mg.  Have also given information for gastroenterology.  Avoid spicy foods, significant caffeine intake, and do not eat close to bedtime.  As lying down immediately after eating can worsen heartburn.

## 2023-03-10 NOTE — Telephone Encounter (Signed)
Patient needs an appointment for after visited Urgent care on Wednesday 5/8 and Monday 5/13. They gave her medication on Wednesday and she took them and felt bad she went back on Monday and they changed the medication and was asked to follow up with her primary care doctor. Patient would also like a referral for her chest, heartburn.

## 2023-03-10 NOTE — ED Provider Notes (Signed)
[place] EMERGENCY DEPARTMENT AT [facility] Provider Note   CSN: [id number] Arrival date & time: 03/10/23  1259     History  Chief Complaint  Patient presents with   Heartburn    Casey Mayo is a 47 y.o. female.  47 year old female presents today for concern of worsening heartburn.  She states she has long standing history of heartburn.  She was recently seen last week and prescribed omeprazole 20 mg.  She does report compliance with.  However she does consume lots of caffeine, spicy foods, and eats dinner close to bedtime.  She denies any definite chest pain.  Certainly does not radiate.  No associated lightheadedness, palpitations, diaphoresis.  Denies significant family history of cardiac disease.  She states she would like a more thorough workup to ensure nothing concerning is going on.  The history is provided by the patient. No language interpreter was used.       Home Medications Prior to Admission medications   Medication Sig Start Date End Date Taking? Authorizing Provider  acetaminophen (TYLENOL) 325 MG tablet Take 650 mg by mouth every 6 (six) hours as needed for mild pain. Reported on 04/21/2016    [provider]  diclofenac Sodium (VOLTAREN) 1 % GEL Apply to affected areas up to 4 times daily as needed. 04/11/22   Julieanne Manson, MD  famotidine (PEPCID) 40 MG tablet Take 1 tablet (40 mg total) by mouth daily. 03/09/23   Rising, Lurena Joiner, PA-C  fluticasone (FLONASE) 50 MCG/ACT nasal spray Place 1 spray into both nostrils daily. 03/04/23   Raspet, Noberto Retort, PA-C  levocetirizine (XYZAL ALLERGY 24HR) 5 MG tablet Take 1 tablet (5 mg total) by mouth every evening. 03/04/23   Raspet, Noberto Retort, PA-C  levonorgestrel (MIRENA) 20 MCG/24HR IUD 1 each by Intrauterine route once. Placed 06/2020 by PHD:  5 year    [provider]  omeprazole (PRILOSEC) 20 MG capsule Take 1 capsule (20mg  total) on an empty stomach half hour before breakfast by mouth daily 03/02/23    Julieanne Manson, MD      Allergies    Patient has no known allergies.    Review of Systems   Review of Systems  Constitutional:  Negative for chills and fever.  Respiratory:  Negative for shortness of breath.   Cardiovascular:  Positive for chest pain. Negative for palpitations and leg swelling.  Gastrointestinal:  Negative for abdominal pain and nausea.  Neurological:  Negative for light-headedness.  All other systems reviewed and are negative.   Physical Exam Updated Vital Signs BP 127/84 (BP Location: Left Arm)   Pulse 85   Temp 98.1 F (36.7 C) (Oral)   Resp 17   SpO2 100%  Physical Exam Vitals and nursing note reviewed.  Constitutional:      General: She is not in acute distress.    Appearance: Normal appearance. She is not ill-appearing.  HENT:     Head: Normocephalic and atraumatic.     Nose: Nose normal.  Eyes:     General: No scleral icterus.    Extraocular Movements: Extraocular movements intact.     Conjunctiva/sclera: Conjunctivae normal.  Cardiovascular:     Rate and Rhythm: Normal rate and regular rhythm.     Pulses: Normal pulses.  Pulmonary:     Effort: Pulmonary effort is normal. No respiratory distress.     Breath sounds: Normal breath sounds. No wheezing or rales.  Abdominal:     General: There is no distension.  Tenderness: There is no abdominal tenderness.  Musculoskeletal:        General: Normal range of motion.     Cervical back: Normal range of motion.  Skin:    General: Skin is warm and dry.  Neurological:     General: No focal deficit present.     Mental Status: She is alert. Mental status is at baseline.     ED Results / Procedures / Treatments   Labs (all labs ordered are listed, but only abnormal results are displayed) Labs Reviewed  CBC WITH DIFFERENTIAL/PLATELET  BASIC METABOLIC PANEL  TROPONIN I (HIGH SENSITIVITY)    EKG None  Radiology No results found.  Procedures Procedures    Medications Ordered in  ED Medications  alum & mag hydroxide-simeth (MAALOX/MYLANTA) 200-200-20 MG/5ML suspension 30 mL (has no administration in time range)    ED Course/ Medical Decision Making/ A&P                             Medical Decision Making Amount and/or Complexity of Data Reviewed Labs: ordered. Radiology: ordered.  Risk OTC drugs.   Medical Decision Making / ED Course   This patient presents to the ED for concern of heartburn/chest pain, this involves an extensive number of treatment options, and is a complaint that carries with it a high risk of complications and morbidity.  The differential diagnosis includes GERD, ACS, PE, pneumonia, MSK etiology  MDM: 47 year old female presents with above-mentioned complaints.  She does have history of heartburn.  Only takes 20 mg of omeprazole daily.  Consumes lots of caffeine, spicy foods, and eats close to bedtime.  Denies prior history of cardiac disease.  No significant family history of cardiac disease.  Story is atypical for ACS.  EKG without acute ischemic changes.  Low heart score.  Will obtain ACS workup.  Will give Maalox.   CBC is unremarkable, BMP without acute concerns.  Initial troponin of 2.  Given duration of symptoms unlikely to be ACS.  Chest x-ray without acute cardiopulmonary process.  Patient is appropriate for discharge.  Omeprazole increased to 40 mg daily.  Gastroenterology information given.   Discharged in stable condition.  Patient is in agreement with plan.   Lab Tests: -I ordered, reviewed, and interpreted labs.   The pertinent results include:   Labs Reviewed  CBC WITH DIFFERENTIAL/PLATELET  BASIC METABOLIC PANEL  TROPONIN I (HIGH SENSITIVITY)      EKG  EKG Interpretation  Date/Time:    Ventricular Rate:    PR Interval:    QRS Duration:   QT Interval:    QTC Calculation:   R Axis:     Text Interpretation:           Imaging Studies ordered: I ordered imaging studies including cxr I independently  visualized and interpreted imaging. I agree with the radiologist interpretation   Medicines ordered and prescription drug management: Meds ordered this encounter  Medications   alum & mag hydroxide-simeth (MAALOX/MYLANTA) 200-200-20 MG/5ML suspension 30 mL    -I have reviewed the patients home medicines and have made adjustments as needed \ Reevaluation: After the interventions noted above, I reevaluated the patient and found that they have :stayed the same  Co morbidities that complicate the patient evaluation  Past Medical History:  Diagnosis Date   GERD (gastroesophageal reflux disease)    Hyperparathyroidism (HCC) 04/2019   Right inferior parathyroid adenoma      Dispostion: Patient discharged  in stable condition.  Return precaution discussed.  Workup overall reassuring.  Final Clinical Impression(s) / ED Diagnoses Final diagnoses:  Gastroesophageal reflux disease, unspecified whether esophagitis present    Rx / DC Orders ED Discharge Orders          Ordered    omeprazole (PRILOSEC) 20 MG capsule  Daily        03/10/23 1454              Marita Kansas, PA-C 03/10/23 1454    Pricilla Loveless, MD 03/13/23 1057

## 2023-03-16 ENCOUNTER — Encounter: Payer: Self-pay | Admitting: Internal Medicine

## 2023-03-16 ENCOUNTER — Ambulatory Visit: Payer: Self-pay | Admitting: Internal Medicine

## 2023-03-16 VITALS — BP 122/68 | HR 72 | Resp 12 | Ht 70.0 in | Wt 208.0 lb

## 2023-03-16 DIAGNOSIS — K219 Gastro-esophageal reflux disease without esophagitis: Secondary | ICD-10-CM

## 2023-03-16 DIAGNOSIS — J302 Other seasonal allergic rhinitis: Secondary | ICD-10-CM | POA: Insufficient documentation

## 2023-03-16 NOTE — Progress Notes (Signed)
    Subjective:    Patient ID: Casey Mayo, female   DOB: April 20, 1976, 47 y.o.   MRN: [id number]   HPI   GERD:  did not elevate HOB.  Was eating spicy foods, and still drinking coffee when went to ED for 3rd time this month.  She did not tolerate the omeprazole capsule as it feels like capsules get stuck in her esophagus.  She ultimately switched back to Famotidine 40 mg and she is no longer having reflux. She relates she is no longer taking the Meloxicam--just brought the bottle in for me to see.   She feels also, since getting her allergies under control, her GERD symptoms are better.   No melena or hematochezia. Today she is feeling better.  Yesterday was first day she started eating more than one meal.    When ill, she was eating only once daily.   2. Nasal stuffiness with posterior pharyngeal drainage.  + nose, eyes, throat and ears with itching.  Eyes were watering  + sneezing.   Not leaving shoes at front door.  Windows closed.   She was taking levocetirizine 5 mg and Fluticasone nasal spray since seen in ED for allergies.  She stopped the levocetirizine as she was scared to take it.  The fluticasone has helped alone.    .      Current Meds  Medication Sig   acetaminophen (TYLENOL) 325 MG tablet Take 650 mg by mouth every 6 (six) hours as needed for mild pain. Reported on 04/21/2016   diclofenac Sodium (VOLTAREN) 1 % GEL Apply to affected areas up to 4 times daily as needed.   fluticasone (FLONASE) 50 MCG/ACT nasal spray Place 1 spray into both nostrils daily.   levocetirizine (XYZAL ALLERGY 24HR) 5 MG tablet Take 1 tablet (5 mg total) by mouth every evening.   levonorgestrel (MIRENA) 20 MCG/24HR IUD 1 each by Intrauterine route once. Placed 06/2020 by PHD:  5 year  Famotidine 40 mg once daily in morning 30 minutes before meal..   No Known Allergies   Review of Systems    Objective:   BP 122/68 (BP Location: Right Arm, Patient Position: Sitting, Cuff Size: Normal)   Pulse  72   Resp 12   Ht 5\' 10"  (1.778 m)   Wt 208 lb (94.3 kg)   BMI 29.84 kg/m   Physical Exam NAD HEENT:  PERRL, EOMI, mild eye watering, no injection of conjunctivae.  TMs pearly gray Right nostril with some swelling Mild cobbling of posterior pharynx Neck:  Supple, No adenopathy Chest:  CTA Abd:  S, NT, No HSM or mass. + BS  Assessment & Plan   GERD:  currently improved using Famotidine 40 mg daily --at first stated she was not taking any acid reducer.  Continue med.  Encouraged her to elevate HOB and not lie down for 2 hours after eating.  Went over foods to avoid and to control allergies as can add to burn in throat.  2.  Seasonal allergies:  leave shoes at door.  Encouraged her to start the xyzal as she still has signs and symptoms of allergies.  Continue nasal corticosteroids.  To get a neti pot and use daily before nasal steroids.  Has follow up at end of year for CPE

## 2023-03-16 NOTE — Telephone Encounter (Signed)
Patient has been scheduled for an appointment.

## 2023-03-16 NOTE — Patient Instructions (Signed)
NEti pot daily--do before the nasal spray

## 2023-03-16 NOTE — Telephone Encounter (Signed)
Patient has been scheduled

## 2023-03-31 ENCOUNTER — Telehealth: Payer: Self-pay

## 2023-03-31 NOTE — Telephone Encounter (Signed)
Patient instructed to be consistent with allergy medication and to utilize neti pot for her sinus. Was also told to keep the head of her bed elevated to help with the reflux.

## 2023-03-31 NOTE — Telephone Encounter (Signed)
Patient called to report that he chest burning from reflux has not gone away. It keeps her from eating. Patient became more concerned yesterday morning because when she woke up, she noticed blood when she blew her nose. She would like to be referred to a specialist.

## 2023-04-02 ENCOUNTER — Ambulatory Visit
Admission: RE | Admit: 2023-04-02 | Discharge: 2023-04-02 | Disposition: A | Discharge status: Home / Self Care | Payer: No Typology Code available for payment source | Source: Ambulatory Visit | Attending: Internal Medicine | Admitting: Internal Medicine

## 2023-04-02 DIAGNOSIS — Z1231 Encounter for screening mammogram for malignant neoplasm of breast: Secondary | ICD-10-CM

## 2023-04-16 ENCOUNTER — Telehealth: Payer: Self-pay

## 2023-04-16 NOTE — Telephone Encounter (Signed)
Patient would like to be seen again for continued allergies and for throat discomfort.

## 2023-04-20 NOTE — Telephone Encounter (Signed)
Patient has been scheduled

## 2023-04-21 ENCOUNTER — Encounter: Payer: Self-pay | Admitting: Internal Medicine

## 2023-04-21 ENCOUNTER — Ambulatory Visit: Payer: Self-pay | Admitting: Internal Medicine

## 2023-04-21 VITALS — BP 134/88 | HR 64 | Resp 16 | Ht 70.0 in | Wt 201.0 lb

## 2023-04-21 DIAGNOSIS — F410 Panic disorder [episodic paroxysmal anxiety] without agoraphobia: Secondary | ICD-10-CM

## 2023-04-21 DIAGNOSIS — F439 Reaction to severe stress, unspecified: Secondary | ICD-10-CM | POA: Insufficient documentation

## 2023-04-21 DIAGNOSIS — K219 Gastro-esophageal reflux disease without esophagitis: Secondary | ICD-10-CM

## 2023-04-21 DIAGNOSIS — J302 Other seasonal allergic rhinitis: Secondary | ICD-10-CM

## 2023-04-21 DIAGNOSIS — R002 Palpitations: Secondary | ICD-10-CM | POA: Insufficient documentation

## 2023-04-21 MED ORDER — ESCITALOPRAM OXALATE 10 MG PO TABS: ORAL_TABLET | ORAL | 4 refills | Status: DC

## 2023-04-21 MED ORDER — LEVOCETIRIZINE DIHYDROCHLORIDE 5 MG PO TABS: 5.0000 mg | ORAL_TABLET | Freq: Every evening | ORAL | 11 refills | Status: DC

## 2023-04-21 NOTE — Progress Notes (Unsigned)
Subjective:    Patient ID: Casey Mayo, female   DOB: 07/20/1976, 47 y.o.   MRN: [id number]   HPI   GERD:  Feels this is improved on Famotidine.  She now has HOB elevated.  She also has cut out all things that seem to exacerbate her reflux.  Does not lie down for 2 hours or more after eating.     2.   Allergies: Was using the Xyzal and Flonase regularly and allergy symptoms were improved--nose clear and throat fine.  She has been using neti pot daily in morning, but waiting until before bed to use Flonase and feels she is getting burning and dysphagia type symptoms in throat and into chest that can awaken her.   Feels this is due to the Flonase. Does not feel this dysphagia and burning in chest is similar to what she has with reflux.   Started about 04/03/2023 and went to [employer].  Not clear if they thought she was taking Nexium or confused with Omeprazole, which she stopped taking in May before her last visit here.   Regardless, she did not fill any prescription for Nexium. She did fill Cepacol, which reportedly was recommended, but feels it did not help She confuses Omeprazole today with Famotidine and then recognizes she is still not taking Omeprazole When she called in on Friday of last week. She decided to stop all her meds.  Cannot get a clear reason why. She also did not fill her Famotidine and is currently out.    3.  Episodes of heart pounding when at rest.  Has not checked her pulse with this.  Occurs twice daily.  No associated dyspnea or chest pain.  Lasts 30 minutes.  She does get shaky with these episodes.  A problem for 2 months. States she is afraid she will have heart disease as her mother died of MI at age 92.  Reportedly obese and had been taking medicine for CAD in years prior.   Gives history she has been very stressed due to finding vaping material in her 2 yo daughter's room, concerned with the company her daughter keeps, and a sample of infant formula that came  to their home for her daughter.  She has had discussions about all of this with her daughter.  Current Meds  Medication Sig   diclofenac Sodium (VOLTAREN) 1 % GEL Apply to affected areas up to 4 times daily as needed.   famotidine (PEPCID) 40 MG tablet Take 1 tablet (40 mg total) by mouth daily.   levonorgestrel (MIRENA) 20 MCG/24HR IUD 1 each by Intrauterine route once. Placed 06/2020 by PHD:  5 year   No Known Allergies   Review of Systems    Objective:   BP 134/88 (BP Location: Left Arm, Patient Position: Sitting, Cuff Size: Normal)   Pulse 64   Resp 16   Ht 5\' 10"  (1.778 m)   Wt 201 lb (91.2 kg)   BMI 28.84 kg/m   Physical Exam RRR ? Check on previous murmur  Assessment & Plan   GERD:  not clear if dysphagia from this or other.  See below.  Get Famotidine filled and restarted.  2.  Allergies:  restart Fluticasone nasal, but use in morning after Neti Pot.  Then drink water or milk to clear from throat.  3.  Stress/Panic Disorder:  referral to [facility], [facility].  Escitalopram 10 mg daily.  Follow up in 2 weeks.  Went over conversations to discuss  with her daughter.  Concerns for activity with friends/vaping/receiving formula samples in the mail

## 2023-04-22 DIAGNOSIS — F410 Panic disorder [episodic paroxysmal anxiety] without agoraphobia: Secondary | ICD-10-CM | POA: Insufficient documentation

## 2023-04-27 ENCOUNTER — Telehealth: Payer: Self-pay | Admitting: Internal Medicine

## 2023-04-27 NOTE — Telephone Encounter (Signed)
Patient would like to get advise on the following symptoms since 04/20/23.  *feeling tired *she feels very hot under her breasts *when she spits, a little blood comes out  No other symptoms.  Discussed with Dr. Delrae Alfred. Patient to keep scheduled appointment for 05/05/23 to evaluate.  Patient informed and verbalized understanding.

## 2023-05-01 ENCOUNTER — Ambulatory Visit (INDEPENDENT_AMBULATORY_CARE_PROVIDER_SITE_OTHER): Payer: Self-pay | Admitting: Otolaryngology

## 2023-05-01 ENCOUNTER — Encounter (INDEPENDENT_AMBULATORY_CARE_PROVIDER_SITE_OTHER): Payer: Self-pay | Admitting: Otolaryngology

## 2023-05-01 VITALS — BP 178/78 | HR 86 | Ht 69.0 in | Wt 225.0 lb

## 2023-05-01 DIAGNOSIS — J343 Hypertrophy of nasal turbinates: Secondary | ICD-10-CM

## 2023-05-01 DIAGNOSIS — D380 Neoplasm of uncertain behavior of larynx: Secondary | ICD-10-CM

## 2023-05-01 DIAGNOSIS — J3089 Other allergic rhinitis: Secondary | ICD-10-CM

## 2023-05-01 DIAGNOSIS — R1310 Dysphagia, unspecified: Secondary | ICD-10-CM

## 2023-05-01 DIAGNOSIS — R490 Dysphonia: Secondary | ICD-10-CM

## 2023-05-01 DIAGNOSIS — J342 Deviated nasal septum: Secondary | ICD-10-CM

## 2023-05-01 DIAGNOSIS — J3489 Other specified disorders of nose and nasal sinuses: Secondary | ICD-10-CM

## 2023-05-01 DIAGNOSIS — K219 Gastro-esophageal reflux disease without esophagitis: Secondary | ICD-10-CM

## 2023-05-01 DIAGNOSIS — R042 Hemoptysis: Secondary | ICD-10-CM

## 2023-05-01 DIAGNOSIS — J381 Polyp of vocal cord and larynx: Secondary | ICD-10-CM

## 2023-05-01 DIAGNOSIS — R09A2 Foreign body sensation, throat: Secondary | ICD-10-CM

## 2023-05-01 MED ORDER — FAMOTIDINE 20 MG PO TABS
20.0000 mg | ORAL_TABLET | Freq: Two times a day (BID) | ORAL | 3 refills | Status: DC
Start: 2023-05-01 — End: 2023-11-06

## 2023-05-01 MED ORDER — CETIRIZINE HCL 10 MG PO TABS
10.0000 mg | ORAL_TABLET | Freq: Every day | ORAL | 11 refills | Status: DC
Start: 2023-05-01 — End: 2023-08-19

## 2023-05-01 NOTE — Progress Notes (Signed)
ENT CONSULT:  Reason for Consult: throat discomfort /other  Referring Physician:  Reubin Milan, MD   HPI: Casey Mayo is an 47 y.o. female with a recent visit to ED for heartburn like sx, here for evaluation of her throat discomfort, throat pain, hemoptysis and dysphonia/dysphagia.  She is here for throat discomfort, neck tightness/pressure (worse at night). She feels that it is hard to swallow spicy foods, meats/bread and large pills.  No smoking or heavy drinking hx, no weight loss or night sweats. She has chronic nasal congestion. She was started on Famotidine 40 mg 03/09/23 but the pills were hard to swallow and she scaled down to twice a week (takes it as needed). She uses TUMS once a day and it helps. She sounds raspy almost all the time. She denies coughing or choking with foods or liquids. She denies dyspnea.   She had right parathyroid adenoma removed 04/04/2021   Records Reviewed:  OP note - had right inferior parathyroid adenoma removed  ED visit note - negative workup for chest pain/cardiac Prescribed Famotidine and Zyrtec     Past Medical History:  Diagnosis Date   GERD (gastroesophageal reflux disease)    Hyperparathyroidism (HCC) 04/2019   Right inferior parathyroid adenoma    Past Surgical History:  Procedure Laterality Date   MASS EXCISION Right 04/04/2021   Procedure: EXCISION RIGHT PARATHYROID ADENOMA;  Surgeon: Abigail Miyamoto, MD;  Location: [facility];  Service: General;  Laterality: Right;    Family History  Problem Relation Age of Onset   Heart disease Mother        Possibly CHF   Asthma Daughter    Asthma Son    Breast cancer Neg Hx     Social History:  reports that she has never smoked. She has never been exposed to tobacco smoke. She has never used smokeless tobacco. She reports that she does not drink alcohol and does not use drugs.  Allergies: No Known Allergies  Medications: I have reviewed the patient's current medications.    The PMH,  PSH, Medications, Allergies, and SH were reviewed and updated.  ROS: Constitutional: Negative for fever, weight loss and weight gain. Cardiovascular: Negative for chest pain and dyspnea on exertion. Respiratory: Is not experiencing shortness of breath at rest. Gastrointestinal: Negative for nausea and vomiting. Neurological: Negative for headaches. Psychiatric: The patient is not nervous/anxiou  Blood pressure (!) 178/78, pulse 86, height 5\' 9"  (1.753 m), weight 225 lb (102.1 kg), SpO2 98 %.  PHYSICAL EXAM:  Exam: General: Well-developed, well-nourished Communication and Voice: raspy and low-pitch  Respiratory Respiratory effort: Equal inspiration and expiration without stridor Cardiovascular Peripheral Vascular: Warm extremities with equal color/perfusion Eyes: No nystagmus with equal extraocular motion bilaterally Neuro/Psych/Balance: Patient oriented to person, place, and time; Appropriate mood and affect; Gait is intact with no imbalance; Cranial nerves I-XII are intact Head and Face Inspection: Normocephalic and atraumatic without mass or lesion Palpation: Facial skeleton intact without bony stepoffs Salivary Glands: No mass or tenderness Facial Strength: Facial motility symmetric and full bilaterally ENT Pinna: External ear intact and fully developed External canal: Canal is patent with intact skin Tympanic Membrane: Clear and mobile External Nose: No scar or anatomic deformity Internal Nose: Deviated nasal septum, mid septal spur on the left, and caudal spur on the right side, right side is nearly completely obstructed 2/2 NSD and ITH, b/l ITH. No edema, polyp, or rhinorrhea Lips, Teeth, and gums: Mucosa and teeth intact and viable TMJ: No pain to palpation with full  mobility Oral cavity/oropharynx: No erythema or exudate, no lesions present Nasopharynx: No mass or lesion with intact mucosa Hypopharynx: Intact mucosa without pooling of secretions Larynx Glottic: Full  true vocal cord mobility without lesion or mass, mild edema, evidence of Left mid VF lesion unclear if submucosal, small amount of mucus, cleared Supraglottic: Normal appearing epiglottis and AE folds Interarytenoid Space: Moderate pachydermia/edema Subglottic Space: Patent without lesion or edema Neck Neck and Trachea: Midline trachea without mass or lesion Thyroid: No mass or nodularity Lymphatics: No lymphadenopathy  Procedure: Preoperative diagnosis: dysphonia, dysphagia and hemoptysis   Postoperative diagnosis:   Same + Left vocal fold lesion + GERD/LPR  Procedure: Flexible fiberoptic laryngoscopy  Surgeon: Ashok Croon, MD  Anesthesia: Topical lidocaine and Afrin Complications: None Condition is stable throughout exam  Indications and consent:  The patient presents to the clinic with Indirect laryngoscopy view was incomplete. Thus it was recommended that they undergo a flexible fiberoptic laryngoscopy. All of the risks, benefits, and potential complications were reviewed with the patient preoperatively and verbal informed consent was obtained.  Procedure: The patient was seated upright in the clinic. Topical lidocaine and Afrin were applied to the nasal cavity. After adequate anesthesia had occurred, I then proceeded to pass the flexible telescope into the nasal cavity. The nasal cavity was patent without rhinorrhea or polyp. The nasopharynx was also patent without mass or lesion. The base of tongue was visualized and was normal. There were no signs of pooling of secretions in the piriform sinuses. The true vocal folds were mobile bilaterally. There was a lesion on the left mid 1/3 true VF. There was moderate interarytenoid pachydermia and post cricoid edema. The telescope was then slowly withdrawn and the patient tolerated the procedure throughout.   Second procedure  PROCEDURE NOTE: nasal endoscopy  Preoperative diagnosis: chronic sinusitis symptoms  Postoperative  diagnosis: same  Procedure: Diagnostic nasal endoscopy (82956)  Surgeon: Ashok Croon, M.D.  Anesthesia: Topical lidocaine and Afrin  H&P REVIEW: The patient's history and physical were reviewed today prior to procedure. All medications were reviewed and updated as well. Complications: None Condition is stable throughout exam Indications and consent: The patient presents with symptoms of chronic sinusitis not responding to previous therapies. All the risks, benefits, and potential complications were reviewed with the patient preoperatively and informed consent was obtained. The time out was completed with confirmation of the correct procedure.   Procedure: The patient was seated upright in the clinic. Topical lidocaine and Afrin were applied to the nasal cavity. After adequate anesthesia had occurred, the rigid nasal endoscope was passed into the nasal cavity. The nasal mucosa, turbinates, septum, and sinus drainage pathways were visualized bilaterally. This revealed no purulence or significant secretions that might be cultured. There were no polyps or sites of significant inflammation. The mucosa was intact and there no crusting present. The scope was then slowly withdrawn and the patient tolerated the procedure well. There were no complications or blood loss.   Studies Reviewed: CBC 03/10/23 normal WBC/overall normal  Negative trop 03/10/23 A1C 5.8   surgical path 04/04/2021  SURGICAL PATHOLOGY CASE: [id number] PATIENT: Casey Mayo Surgical Pathology Report     Clinical History: Right parathyroid adenoma ([facility])   FINAL MICROSCOPIC DIAGNOSIS:  A. PARATHYROID, RIGHT INFERIOR, PARATHYROIDECTOMY: -  Hypercellular parathyroid tissue (1.9 g) consistent with adenoma     Assessment/Plan: Encounter Diagnoses  Name Primary?   Gastroesophageal reflux disease without esophagitis    Environmental and seasonal allergies    Globus pharyngeus  Hemoptysis    Dysphagia,  unspecified type Yes   Dysphonia    Neoplasm of uncertain behavior of larynx    Vocal fold polyp    Deviated nasal septum    Hypertrophy of both inferior nasal turbinates    47 year old female with a recent visit to the ER due to globus sensation and heartburn is here for evaluation.  She endorses dysphonia to voice and difficulties with swallowing mostly to solids and pills.  She denies coughing or choking on foods or food regurgitation.  No prior swallow studies or upper endoscopy with GI.  She reports she sometimes coughs up blood small amount and episodes are infrequent.  She endorses active heartburn symptoms including burning in her upper chest had negative cardiac workup in the ER.  She denies dyspnea, denies weight loss, tolerating regular diet at this point, no history of pneumonia.   Her exam today demonstrated left mid vocal fold lesion that we will better assess with video stroboscopy when she returns.  Will order modified barium swallow and esophagram to better assess her trouble with swallowing differential diagnosis includes oropharyngeal versus esophageal dysphagia.  Based on the history alone it is unclear where the source of bleeding is coming from when she coughs up small amounts of blood-tinged secretions although I suspect this could be related to low volume nosebleed other possibilities include GI sources and pulmonary sources although my suspicion of those 2 is very low.    We will refer her to see GI for EGD, and review her swallow study once it is done.  The most likely explanation of her trouble swallowing is uncontrolled reflux in the setting of active heartburn symptoms.  Will start her on famotidine 20 mg alginate therapy.  I also suspect that she has chronic nasal congestion and seasonal or environmental allergies based on sensation of mucus and postnasal drainage.  She is using Flonase already and we will prescribe an antihistamine to add to that.   - RTC 3 weeks for  videostrobe - will decide If and when do do DML bx excision for tissue diagnosis  - GI referral for EGD - MBS/esophagram for dysphagia  - Famotidine 20 mg BID and alginate therapy, discussed diet and lifestyle changes to minimize reflux  - Cetirizine/Flonase for nasal congestion and PND/allergies - will consider chest imaging and referral to pulm in the future if she continues to have hemoptysis -She reports nasal obstruction and difficulties with breathing through her nose and has evidence of left-sided septal spur and right-sided nasal septal deviation with significant obstruction of both nasal passages.  If fails medical management of that we will consider surgical options including septoplasty and inferior turbinate reduction.   -She had elevated high blood pressure today (SBP 178) and does not have any history of hypertension, we discussed with the patient that she needs to have it checked with her primary care physician, she indicated she will have a follow-up appointment in 2 days and will discuss that with her primary care provider.   Thank you for allowing me to participate in the care of this patient. Please do not hesitate to contact me with any questions or concerns.   Ashok Croon, MD Otolaryngology [facility] Phone: [phone] Fax: [phone]    05/01/2023, 1:25 PM

## 2023-05-01 NOTE — Patient Instructions (Addendum)
-   schedule appointment with GI  - schedule swallow study - start Zyrtec and continue Flonase  - return in 3 weeks for videostrobe  - buy Reflux [employer] on [employer] - take Famotidine and stop TUMS - learn about reflux and diet

## 2023-05-04 ENCOUNTER — Other Ambulatory Visit (INDEPENDENT_AMBULATORY_CARE_PROVIDER_SITE_OTHER): Payer: Self-pay | Admitting: Physician Assistant

## 2023-05-04 ENCOUNTER — Telehealth: Payer: Self-pay | Admitting: Psychology

## 2023-05-04 DIAGNOSIS — R09A2 Foreign body sensation, throat: Secondary | ICD-10-CM

## 2023-05-04 DIAGNOSIS — R490 Dysphonia: Secondary | ICD-10-CM

## 2023-05-04 DIAGNOSIS — K219 Gastro-esophageal reflux disease without esophagitis: Secondary | ICD-10-CM

## 2023-05-04 DIAGNOSIS — R1310 Dysphagia, unspecified: Secondary | ICD-10-CM

## 2023-05-05 ENCOUNTER — Ambulatory Visit: Payer: Self-pay | Admitting: Internal Medicine

## 2023-05-05 ENCOUNTER — Other Ambulatory Visit (HOSPITAL_COMMUNITY): Payer: Self-pay | Admitting: *Deleted

## 2023-05-05 ENCOUNTER — Encounter: Payer: Self-pay | Admitting: Internal Medicine

## 2023-05-05 VITALS — BP 120/70 | HR 72 | Resp 16 | Ht 69.0 in | Wt 203.0 lb

## 2023-05-05 DIAGNOSIS — F439 Reaction to severe stress, unspecified: Secondary | ICD-10-CM

## 2023-05-05 DIAGNOSIS — K219 Gastro-esophageal reflux disease without esophagitis: Secondary | ICD-10-CM

## 2023-05-05 DIAGNOSIS — R1310 Dysphagia, unspecified: Secondary | ICD-10-CM

## 2023-05-05 DIAGNOSIS — J302 Other seasonal allergic rhinitis: Secondary | ICD-10-CM

## 2023-05-05 DIAGNOSIS — F410 Panic disorder [episodic paroxysmal anxiety] without agoraphobia: Secondary | ICD-10-CM

## 2023-05-05 DIAGNOSIS — R002 Palpitations: Secondary | ICD-10-CM

## 2023-05-05 NOTE — Progress Notes (Signed)
    Subjective:    Patient ID: Casey Mayo, female   DOB: [birth year], 47 y.o.   MRN: [id number]   HPI  Follow up for likely panic disorder:  she is now sleeping better symptoms in her throat are better--seems especially so when taking her Xyzal.  She is using the neti pot/fluticasone  and Xyzal.  She also continues on the famotidine.    She is taking escitalopram.  No suicidal ideation.  Feels she is now sleeping well.  Takes in the evening.  Has not been able to connect with SBT, LCSWA for counseling.    Current Meds  Medication Sig   cetirizine (ZYRTEC) 10 MG tablet Take 1 tablet (10 mg total) by mouth daily.   escitalopram (LEXAPRO) 10 MG tablet 1 tab by mouth daily   famotidine (PEPCID) 20 MG tablet Take 1 tablet (20 mg total) by mouth 2 (two) times daily.   fluticasone (FLONASE) 50 MCG/ACT nasal spray Place 1 spray into both nostrils daily.   levocetirizine (XYZAL ALLERGY 24HR) 5 MG tablet Take 1 tablet (5 mg total) by mouth every evening.   levonorgestrel (MIRENA) 20 MCG/24HR IUD 1 each by Intrauterine route once. Placed 06/2020 by PHD:  5 year   No Known Allergies   Review of Systems    Objective:   BP 120/70 (BP Location: Right Arm, Patient Position: Sitting, Cuff Size: Normal)   Pulse 72   Resp 16   Ht 5\' 9"  (1.753 m)   Wt 203 lb (92.1 kg)   BMI 29.98 kg/m   Physical Exam Calm appearing. Lungs:  CTA CV:  RRR   Assessment & Plan   Probable panic disorder and stress with adolescent daughter.  Continue escitalopram.  Discuss with SBT LCSWA to see where things are at with getting her in for counseling.  2.  GERD:  improved  3.  Allergies:  improved.

## 2023-05-07 ENCOUNTER — Other Ambulatory Visit: Payer: Self-pay | Admitting: Psychology

## 2023-05-12 ENCOUNTER — Telehealth: Payer: Self-pay | Admitting: Psychology

## 2023-05-14 ENCOUNTER — Encounter: Payer: Self-pay | Admitting: Gastroenterology

## 2023-05-20 ENCOUNTER — Ambulatory Visit (HOSPITAL_COMMUNITY)
Admission: RE | Admit: 2023-05-20 | Discharge: 2023-05-20 | Disposition: A | Discharge status: Home / Self Care | Payer: Self-pay | Source: Ambulatory Visit

## 2023-05-20 ENCOUNTER — Ambulatory Visit (HOSPITAL_COMMUNITY)
Admission: RE | Admit: 2023-05-20 | Discharge: 2023-05-20 | Disposition: A | Discharge status: Home / Self Care | Payer: Self-pay | Source: Ambulatory Visit | Attending: Physician Assistant | Admitting: Physician Assistant

## 2023-05-20 DIAGNOSIS — K219 Gastro-esophageal reflux disease without esophagitis: Secondary | ICD-10-CM

## 2023-05-20 DIAGNOSIS — R1313 Dysphagia, pharyngeal phase: Secondary | ICD-10-CM | POA: Insufficient documentation

## 2023-05-20 DIAGNOSIS — R1310 Dysphagia, unspecified: Secondary | ICD-10-CM

## 2023-05-20 DIAGNOSIS — R059 Cough, unspecified: Secondary | ICD-10-CM | POA: Insufficient documentation

## 2023-05-20 DIAGNOSIS — R09A2 Foreign body sensation, throat: Secondary | ICD-10-CM

## 2023-05-20 DIAGNOSIS — R490 Dysphonia: Secondary | ICD-10-CM

## 2023-05-20 NOTE — Therapy (Signed)
Modified Barium Swallow Study  Patient Details  Name: Casey Mayo MRN: [id number] Date of Birth: September 03, 1976  Today's Date: 05/20/2023  Modified Barium Swallow completed.  Full report located under Chart Review in the Imaging Section.  History of Present Illness Casey Mayo is a 47 y.o. female with PMH: GERD, allergies, panic disorder, stress, right parathyroid adenoma removed 04/04/2021. She presented to this OP MBS following OP GI appointment where she c/o throat discomfort, neck tightness/pressure (worse on right), throat pain, chronic nasal congestion, dysponia, dysphagia. She uses Tums daily which she feels helps. Just prior to this MBS, she had Esophagram with results pending. [name] did report that she was told esophagram results were normal. During interview portion of assessment, patient reported that she has been taking Nyquil and this has been helping with her symptoms.   Clinical Impression Aside from swallow initiation delay to pyriform sinus with thin liquids, patient's oropharyngeal swallow is [facility]. Oropharyngeal structures were all functioning properly. No penetration, aspiration or pharyngeal residuals observed during any phase of the swallow. 13mm barium tablet taken with thin liquid barium transited through pharynx and esophagus without observed difficulty or delay. Upper esophagus did appear somewhat dilated. Patient had an esophagram just prior to this MBS and so esophageal sweep was not performed. SLP denied any globus sensation or swallowing difficulty during or after PO intake of any of the barium consistencies. Factors that may increase risk of adverse event in presence of aspiration [name] & [name] 2021):    Swallow Evaluation Recommendations Recommendations: PO diet PO Diet Recommendation: Regular;Thin liquids (Level 0) Liquid Administration via: Cup;Straw Medication Administration: Whole meds with liquid Supervision: Patient able to self-feed     Angela Nevin, MA, CCC-SLP Speech Therapy

## 2023-05-22 ENCOUNTER — Ambulatory Visit (INDEPENDENT_AMBULATORY_CARE_PROVIDER_SITE_OTHER): Payer: Self-pay | Admitting: Otolaryngology

## 2023-05-22 DIAGNOSIS — R042 Hemoptysis: Secondary | ICD-10-CM

## 2023-05-22 DIAGNOSIS — J3489 Other specified disorders of nose and nasal sinuses: Secondary | ICD-10-CM

## 2023-05-22 DIAGNOSIS — R09A2 Foreign body sensation, throat: Secondary | ICD-10-CM

## 2023-05-22 DIAGNOSIS — R1310 Dysphagia, unspecified: Secondary | ICD-10-CM

## 2023-05-22 DIAGNOSIS — J343 Hypertrophy of nasal turbinates: Secondary | ICD-10-CM

## 2023-05-22 DIAGNOSIS — R490 Dysphonia: Secondary | ICD-10-CM

## 2023-05-22 DIAGNOSIS — J383 Other diseases of vocal cords: Secondary | ICD-10-CM

## 2023-05-22 DIAGNOSIS — D380 Neoplasm of uncertain behavior of larynx: Secondary | ICD-10-CM

## 2023-05-22 DIAGNOSIS — K219 Gastro-esophageal reflux disease without esophagitis: Secondary | ICD-10-CM

## 2023-05-22 DIAGNOSIS — J3089 Other allergic rhinitis: Secondary | ICD-10-CM

## 2023-05-22 DIAGNOSIS — J342 Deviated nasal septum: Secondary | ICD-10-CM

## 2023-05-22 NOTE — Patient Instructions (Addendum)
-   see Pulmonary for blood in sputum - referral sent  - schedule CT chest with contrast - see GI doctor as scheduled (Fall) - ask your GI physician to review esophagram results (swallow study) - return in 3 months for f/u

## 2023-05-22 NOTE — Progress Notes (Signed)
ENT PROGRESS NOTE:  Update 05/22/23:  She returns for f/u after swallow study. She reports continued intermittent hemoptysis with small amount blood she coughs up intermittently. She describes intermittent chest tightness. She started medications we discussed last time. She feels Famotidine helps. She scheduled her GI appt, in early Fall (Sept/Oct)  Initial HPI 05/01/23   Reason for Consult: throat discomfort /other  Referring Physician:  Reubin Milan, MD   HPI: Casey Mayo is an 47 y.o. female with a recent visit to ED for heartburn like sx, here for evaluation of her throat discomfort, throat pain, hemoptysis and dysphonia/dysphagia.  She is here for throat discomfort, neck tightness/pressure (worse at night). She feels that it is hard to swallow spicy foods, meats/bread and large pills.  No smoking or heavy drinking hx, no weight loss or night sweats. She has chronic nasal congestion. She was started on Famotidine 40 mg 03/09/23 but the pills were hard to swallow and she scaled down to twice a week (takes it as needed). She uses TUMS once a day and it helps. She sounds raspy almost all the time. She denies coughing or choking with foods or liquids. She denies dyspnea.   She had right parathyroid adenoma removed 04/04/2021  Records Reviewed:  OP note - had right inferior parathyroid adenoma removed  ED visit note - negative workup for chest pain/cardiac Prescribed Famotidine and Zyrtec     Past Medical History:  Diagnosis Date   GERD (gastroesophageal reflux disease)    Hyperparathyroidism (HCC) 04/2019   Right inferior parathyroid adenoma    Past Surgical History:  Procedure Laterality Date   MASS EXCISION Right 04/04/2021   Procedure: EXCISION RIGHT PARATHYROID ADENOMA;  Surgeon: Abigail Miyamoto, MD;  Location: [facility];  Service: General;  Laterality: Right;    Family History  Problem Relation Age of Onset   Heart disease Mother        Possibly CHF   Asthma Daughter     Asthma Son    Breast cancer Neg Hx     Social History:  reports that she has never smoked. She has never been exposed to tobacco smoke. She has never used smokeless tobacco. She reports that she does not drink alcohol and does not use drugs.  Allergies: No Known Allergies  Medications: I have reviewed the patient's current medications.    The PMH, PSH, Medications, Allergies, and SH were reviewed and updated.  ROS: Constitutional: Negative for fever, weight loss and weight gain. Cardiovascular: Negative for chest pain and dyspnea on exertion. Respiratory: Is not experiencing shortness of breath at rest. Gastrointestinal: Negative for nausea and vomiting. Neurological: Negative for headaches. Psychiatric: The patient is not nervous/anxious  PHYSICAL EXAM:  Exam: General: Well-developed, well-nourished Communication and Voice: raspy and low-pitch  Respiratory Respiratory effort: Equal inspiration and expiration without stridor Cardiovascular Peripheral Vascular: Warm extremities with equal color/perfusion Eyes: No nystagmus with equal extraocular motion bilaterally Neuro/Psych/Balance: Patient oriented to person, place, and time; Appropriate mood and affect; Gait is intact with no imbalance; Cranial nerves I-XII are intact Head and Face Inspection: Normocephalic and atraumatic without mass or lesion Palpation: Facial skeleton intact without bony stepoffs Salivary Glands: No mass or tenderness Facial Strength: Facial motility symmetric and full bilaterally ENT Pinna: External ear intact and fully developed External canal: Canal is patent with intact skin Tympanic Membrane: Clear and mobile External Nose: No scar or anatomic deformity Internal Nose: Deviated nasal septum, mid septal spur on the left, and caudal spur on the right side,  right side is nearly completely obstructed 2/2 NSD and ITH, b/l ITH. Mucosal edema.  Lips, Teeth, and gums: Mucosa and teeth intact and  viable TMJ: No pain to palpation with full mobility Oral cavity/oropharynx: No erythema or exudate, no lesions present Nasopharynx: No mass or lesion with intact mucosa Hypopharynx: Intact mucosa without pooling of secretions Larynx See summary of procedure note below  Neck Neck and Trachea: Midline trachea without mass or lesion Thyroid: No mass or nodularity Lymphatics: No lymphadenopathy  Procedure 05/22/23:  Summary of Video-Laryngeal-Stroboscopy: submucosal lesion along the mid aspect of the left vocal fold, smooth in appearance, decreased mucosal wave only at the site of the lesion due to bulkiness of lesion, counter-coup thickening along the right VF, closure is incomplete 2/2 lesion. Sublgottis and proximal trachea are clear. No blood or clot, no mucosal irregularities. Moderate post-cricoid edema/pachydermia c/w GERD/LPR  Preoperative diagnosis: hoarseness  Postoperative diagnosis:   same  Procedure: Flexible fiberoptic laryngoscopy with stroboscopy (13244)  Surgeon: Ashok Croon, MD  Anesthesia: Topical lidocaine and Afrin  Complications: None  Condition is stable throughout exam  Indications and consent:   The patient presents to the clinic with hoarseness. All the risks, benefits, and potential complications were reviewed with the patient preoperatively and informed verbal consent was obtained.  Procedure: The patient was seated upright in the exam chair.   Topical lidocaine and Afrin were applied to the nasal cavity. After adequate anesthesia had occurred, the flexible telescope was passed into the nasal cavity. The nasopharynx was patent without mass or lesion. The scope was passed behind the soft palate and directed toward the base of tongue. The base of tongue was visualized and was symmetric with no apparent masses or abnormal appearing tissue. There were no signs of a mass or pooling of secretions in the piriform sinuses. The supraglottic structures were  normal.  The true vocal cords are mobile. The medial edges were with lesion on the left and thickening of the edge on the right opposite of lesion. Closure was incomplete. Periodicity present. The mucosal wave and amplitude were decreased at the site of a lesion. There is moderate interarytenoid pachydermia and post cricoid edema. The mucosa appears healthy.   The laryngoscope was then slowly withdrawn and the patient tolerated the procedure well. There were no complications or blood loss.     Studies Reviewed: CBC 03/10/23 normal WBC/overall normal  Negative trop 03/10/23 A1C 5.8   surgical path 04/04/2021  SURGICAL PATHOLOGY CASE: [id number] PATIENT: Orion Crook Surgical Pathology Report     Clinical History: Right parathyroid adenoma ([facility])   FINAL MICROSCOPIC DIAGNOSIS:  A. PARATHYROID, RIGHT INFERIOR, PARATHYROIDECTOMY: -  Hypercellular parathyroid tissue (1.9 g) consistent with adenoma     Assessment/Plan: Encounter Diagnoses  Name Primary?   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  She endorses dysphonia to voice and difficulties with swallowing mostly to solids and pills.  She denies coughing or choking on foods or food regurgitation.  No prior swallow studies or upper endoscopy with GI.  She reports she sometimes coughs up blood small amount and episodes are infrequent.  She endorses active heartburn symptoms including burning in her upper chest had negative cardiac workup in the ER.  She denies dyspnea, denies weight loss, tolerating regular diet at  this point, no history of pneumonia.   Her exam today demonstrated left mid vocal fold lesion that we will better assess with video stroboscopy when she returns.  Will order modified barium swallow and esophagram to better assess her trouble with swallowing differential diagnosis includes oropharyngeal versus esophageal dysphagia.  Based on the history alone it is unclear where the source of bleeding is coming from when she coughs up small amounts of blood-tinged secretions although I suspect this could be related to low volume nosebleed other possibilities include GI sources and pulmonary sources although my suspicion of those 2 is very low.    We will refer her to see GI for EGD, and review her swallow study once it is done.  The most likely explanation of her trouble swallowing is uncontrolled reflux in the setting of active heartburn symptoms.  Will start her on famotidine 20 mg alginate therapy.  I also suspect that she has chronic nasal congestion and seasonal or environmental allergies based on sensation of mucus and postnasal drainage.  She is using Flonase already and we will prescribe an antihistamine to add to that.   - RTC 3 weeks for videostrobe - will decide If and when do do DML bx excision for tissue diagnosis  - GI referral for EGD - MBS/esophagram for dysphagia  - Famotidine 20 mg BID and alginate therapy, discussed diet and lifestyle changes to minimize reflux  - Cetirizine/Flonase for nasal congestion and PND/allergies - will consider chest imaging and referral to pulm in the future if she continues to have hemoptysis -She reports nasal obstruction and difficulties with breathing through her nose and has evidence of left-sided septal spur and right-sided nasal septal deviation with significant obstruction of both nasal passages.  If fails medical management of that we will consider surgical options including septoplasty and inferior turbinate reduction.   -She had elevated high  blood pressure today (SBP 178) and does not have any history of hypertension, we discussed with the patient that she needs to have it checked with her primary care physician, she indicated she will have a follow-up appointment in 2 days and will discuss that with her primary care provider.   Update 05/22/23 She continues to have hemoptysis. MBS/esophagram reviewed with her today, normal OP swallow on MBS and evidence of small (tiny in my opinion) outpouching of the thoracic esophagus. She has f/u with GI scheduled in a few weeks and I encouraged her to discuss esophagram findings with GI. We did a videostrobe today and it revealed submucosal lesion on the left side (see summary of the findings above). I do not think this is a source of her hemoptysis. We did not see any obvious source of epistaxis or source of bleeding in her mouth oropharynx today. No prior chest imaging. She checked her BP and it was normal twice, no indication for medication at this time.   - see Pulmonary for blood in sputum - referral sent  - schedule CT chest with contrast - see GI doctor as scheduled (Fall) - ask your GI physician to review esophagram results (swallow study) - return in 3 months for f/u - will schedule DML and microflap excision of the left vocal fold cyst which has appearance of likely  a benign lesion (submucosal). Not urgent at this time and will complete workup for hemoptysis prior to scheduling her surgery.  Ashok Croon, MD Otolaryngology [facility] Phone: [phone] Fax: [phone]    05/22/2023, 1:20 PM

## 2023-06-08 ENCOUNTER — Telehealth: Payer: Self-pay

## 2023-06-08 NOTE — Telephone Encounter (Signed)
CPT code 19147 Patient is self pay

## 2023-06-24 ENCOUNTER — Ambulatory Visit: Payer: Self-pay | Admitting: Internal Medicine

## 2023-06-24 VITALS — BP 136/80 | HR 82 | Resp 20 | Ht 69.0 in | Wt 206.0 lb

## 2023-06-24 DIAGNOSIS — K219 Gastro-esophageal reflux disease without esophagitis: Secondary | ICD-10-CM

## 2023-06-24 DIAGNOSIS — J302 Other seasonal allergic rhinitis: Secondary | ICD-10-CM

## 2023-06-24 DIAGNOSIS — F410 Panic disorder [episodic paroxysmal anxiety] without agoraphobia: Secondary | ICD-10-CM

## 2023-06-24 DIAGNOSIS — R490 Dysphonia: Secondary | ICD-10-CM | POA: Insufficient documentation

## 2023-06-24 MED ORDER — MONTELUKAST SODIUM 10 MG PO TABS: 10.0000 mg | ORAL_TABLET | Freq: Every day | ORAL | 11 refills | Status: DC

## 2023-06-24 NOTE — Progress Notes (Unsigned)
    Subjective:    Patient ID: Casey Mayo, female   DOB: 02-Dec-1975, 47 y.o.   MRN: [id number]   HPI   GERD:  States she no longer has any symptoms of heartburn, but continues with dysphagia.  She clarifies she IS taking her Famotidine 40 mg (she crushes it) and takes 1/2 hour before her breakfast.   She crushes all her pills as has difficulty swallowing still.  She states she feels she gets only solids caught in her throat (above thoracic level) and she has to drink water over and over again to get the food to pass down.  Once the food passes below pharyngeal level, she feels there is no further obstruction. She states when she milks her anterior neck upward, she is able to trigger hemoptysis--describes stranding of blood in saliva.  Otherwise, she is no longer coughing up blood for past week. Previously, she would clear her throat and then spit up the blood/saliva mix--usually with brushing her teeth. HOB elevated. Has made significant dietary changes. She is now being followed by [facility] and has been referred to Pumonary and GI  Recent modified barium swallow appeared normal--small thoracic diverticulum. She underwent naso-laryngoscopy on 7/26 with ENT with findings of a submucosal lesion involving left vocal cord on left with thickening of opposing right vocal cord.   She shares she is returning tomorrow for further evaluation.   Note from 7/26 shows plan for surgery to remove the submucosal lesion.  She is scheduled for CT of chest, GI and pulmonary referrals for the hemoptysis and GERD symptoms.    2.  Allergies:  she did not feel the Neti pot helped with her throat and upper airway symptoms.  She continues with the nasal fluticasone spray 2 sprays each nostril and Levocetirizine 5 mg .  She feels her posterior pharyngeal drainage and nasal congestion/sneezing are better.  She has constant itching in her ears and throat--that has not resolved.  She is continually clearing her throat.     3.  Panic Disorder:  No further episodes of palpitations and jitteriness for past 7 weeks.  Sleeping well now.  Still has not met with Morene Antu, [place].  Current Meds  Medication Sig  . cetirizine (ZYRTEC) 10 MG tablet Take 1 tablet (10 mg total) by mouth daily.  Marland Kitchen escitalopram (LEXAPRO) 10 MG tablet 1 tab by mouth daily  . famotidine (PEPCID) 20 MG tablet Take 1 tablet (20 mg total) by mouth 2 (two) times daily.  . fluticasone (FLONASE) 50 MCG/ACT nasal spray Place 1 spray into both nostrils daily.  Marland Kitchen levocetirizine (XYZAL ALLERGY 24HR) 5 MG tablet Take 1 tablet (5 mg total) by mouth every evening.  Marland Kitchen levonorgestrel (MIRENA) 20 MCG/24HR IUD 1 each by Intrauterine route once. Placed 06/2020 by PHD:  5 year   No Known Allergies   Review of Systems    Objective:   BP 136/80 (BP Location: Left Arm, Patient Position: Sitting, Cuff Size: Normal)   Pulse 82   Resp 20   Ht 5\' 9"  (1.753 m)   Wt 206 lb (93.4 kg)   BMI 30.42 kg/m   Physical Exam   Assessment & Plan

## 2023-06-25 ENCOUNTER — Ambulatory Visit (HOSPITAL_COMMUNITY)
Admission: RE | Admit: 2023-06-25 | Discharge: 2023-06-25 | Disposition: A | Discharge status: Home / Self Care | Payer: No Typology Code available for payment source | Source: Ambulatory Visit | Attending: Otolaryngology | Admitting: Otolaryngology

## 2023-06-25 ENCOUNTER — Encounter: Payer: Self-pay | Admitting: Internal Medicine

## 2023-06-25 DIAGNOSIS — R042 Hemoptysis: Secondary | ICD-10-CM

## 2023-06-26 ENCOUNTER — Ambulatory Visit: Payer: No Typology Code available for payment source | Admitting: Family

## 2023-06-30 ENCOUNTER — Other Ambulatory Visit: Payer: Self-pay | Admitting: Psychology

## 2023-07-03 ENCOUNTER — Ambulatory Visit (HOSPITAL_COMMUNITY)
Admission: RE | Admit: 2023-07-03 | Discharge: 2023-07-03 | Disposition: A | Discharge status: Home / Self Care | Payer: No Typology Code available for payment source | Source: Ambulatory Visit | Attending: Otolaryngology | Admitting: Otolaryngology

## 2023-07-03 DIAGNOSIS — R042 Hemoptysis: Secondary | ICD-10-CM | POA: Insufficient documentation

## 2023-07-03 MED ORDER — IOHEXOL 350 MG/ML SOLN
75.0000 mL | Freq: Once | INTRAVENOUS | Status: AC | PRN
  Administered 2023-07-03: 75 mL via INTRAVENOUS

## 2023-07-07 ENCOUNTER — Ambulatory Visit (INDEPENDENT_AMBULATORY_CARE_PROVIDER_SITE_OTHER): Payer: Self-pay | Admitting: Psychology

## 2023-07-07 DIAGNOSIS — F439 Reaction to severe stress, unspecified: Secondary | ICD-10-CM

## 2023-07-15 ENCOUNTER — Telehealth: Payer: Self-pay

## 2023-07-15 NOTE — Telephone Encounter (Signed)
Voicemail was left for provider to call [facility] for results.   Call back # [phone]

## 2023-07-16 ENCOUNTER — Encounter: Payer: Self-pay | Admitting: Pulmonary Disease

## 2023-07-28 ENCOUNTER — Ambulatory Visit: Payer: No Typology Code available for payment source | Admitting: Gastroenterology

## 2023-07-28 ENCOUNTER — Telehealth: Payer: Self-pay | Admitting: Gastroenterology

## 2023-07-29 ENCOUNTER — Encounter: Payer: Self-pay | Admitting: Gastroenterology

## 2023-07-29 ENCOUNTER — Ambulatory Visit (INDEPENDENT_AMBULATORY_CARE_PROVIDER_SITE_OTHER): Payer: Self-pay | Admitting: Gastroenterology

## 2023-07-29 VITALS — BP 122/70 | HR 63 | Ht 69.0 in | Wt 212.0 lb

## 2023-07-29 DIAGNOSIS — K219 Gastro-esophageal reflux disease without esophagitis: Secondary | ICD-10-CM

## 2023-07-29 DIAGNOSIS — K225 Diverticulum of esophagus, acquired: Secondary | ICD-10-CM

## 2023-07-29 DIAGNOSIS — Z1212 Encounter for screening for malignant neoplasm of rectum: Secondary | ICD-10-CM

## 2023-07-29 DIAGNOSIS — Z1211 Encounter for screening for malignant neoplasm of colon: Secondary | ICD-10-CM

## 2023-07-29 NOTE — Patient Instructions (Addendum)
You have been scheduled for an endoscopy. Please follow written instructions given to you at your visit today.  If you use inhalers (even only as needed), please bring them with you on the day of your procedure.  If you take any of the following medications, they will need to be adjusted prior to your procedure:   DO NOT TAKE 7 DAYS PRIOR TO TEST- Trulicity (dulaglutide) Ozempic, Wegovy (semaglutide) Mounjaro (tirzepatide) Bydureon Bcise (exanatide extended release)  DO NOT TAKE 1 DAY PRIOR TO YOUR TEST Rybelsus (semaglutide) Adlyxin (lixisenatide) Victoza (liraglutide) Byetta (exanatide) ___________________________________________________________________________   Your provider has ordered Cologuard testing as an option for colon cancer screening. This is performed by [employer] and may be out of network with your insurance. PRIOR to completing the test, it is YOUR responsibility to contact your insurance about covered benefits for this test. Your out of pocket expense could be anywhere from $0.00 to $649.00.   When you call to check coverage with your insurer, please provide the following information:   -The ONLY provider of Cologuard is [facility], dispensing  - CPT code for Cologuard is [phone].  [facility] NPI # [id number]  -[employer] Tax ID # [id number]   We have already sent your demographic and insurance information to [employer] (phone number [phone]) and they should contact you within the next week regarding your test. If you have not heard from them within the next week, please call our office at [phone].  It was a pleasure to see you today!  Thank you for trusting me with your gastrointestinal care!    Scott E.Tomasa Rand, MD

## 2023-07-29 NOTE — Progress Notes (Signed)
HPI : Casey Mayo is a 47 y.o. female with a history of hyperparathyroidism status post right parathyroid adenoma excision who is referred to [place] by Julieanne Manson, MD for further evaluation management of chronic GERD symptoms.  She was seen by ENT in July.  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  ENT felt that her symptoms were most likely related to reflux, possibly with postnasal drip contributing as well.  She was prescribed famotidine and alginate therapy as well as cetirizine and intranasal steroids.  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  Study was otherwise normal, with no significant esophageal dysmotility, hiatal hernia or reflux noted.  A subsequent chest CT noted a indeterminate soft tissue mass in the mediastinum, follow-up imaging was recommended in 3 months.   She describes having symptoms of burning pain in her chest and acid regurgitation.  In addition to the symptoms, she was having swallowing difficulties manifested as the sensation of food and pills getting stuck in her esophagus.  This was very prominent in June, but she denies having any solid dysphagia currently.  She still does not swallow any pills because she is scared they are going to get stuck.  Therefore, she crushes all of her pills.  She currently is able to eat things such as steak and chicken without sensation of food getting stuck or having to wash it down. She has other possible GERD related symptoms to include hoarseness and occasional cough.  She reports a fullness in her ears as well as nasal congestion.  No problems with abdominal pain, nausea or vomiting.  She also reports seeing blood in her saliva.  She states that she is able to often bring up saliva which is blood-tinged.  She cannot say where this is coming from, but she does not think it is from her mouth.  She does think that when her heartburn is worse, she is more likely to notice  the blood.  Her heartburn and acid regurgitation were more noticeable in the evenings.  She has made some dietary changes which has helped with her symptoms.  She has stopped eating fatty or oily foods in the evening.  She does not drink alcohol and is a non-smoker.  She does drink coffee daily.  She reports losing some weight over the summer when her symptoms were more severe, but she has regained some of that weight.  She takes famotidine once daily.  She denies any chronic lower GI symptoms to include obstipation, diarrhea or blood in the stool.  She has never had any colon cancer screening tests.  No family history of colon cancer.    CT Chest w/ contrast Sept 6, 2024 IMPRESSION: 1. No acute abnormality in the chest. 2. Indeterminate soft tissue in the right upper anterior mediastinum. This could represent atypical thymic tissue but indeterminate. Recommend 3 to 6 month follow-up chest CT with IV contrast to evaluate for stability.  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 See key image.   Otherwise, normal esophagram  Past Medical History:  Diagnosis Date   GERD (gastroesophageal reflux disease)    Hyperparathyroidism (HCC) 04/2019   Right inferior parathyroid adenoma     Past Surgical History:  Procedure Laterality Date   MASS EXCISION Right 04/04/2021   Procedure: EXCISION RIGHT PARATHYROID ADENOMA;  Surgeon: Abigail Miyamoto, MD;  Location: [facility];  Service: General;  Laterality: Right;   Family History  Problem Relation Age of Onset   Heart disease Mother        Possibly CHF   Asthma Daughter    Asthma Son    Breast cancer Neg Hx    Social History   Tobacco Use   Smoking status: Never    Passive exposure: Never   Smokeless tobacco: Never  Vaping Use   Vaping status: Never Used  Substance Use Topics   Alcohol use: No   Drug use: No   Current Outpatient Medications  Medication Sig  Dispense Refill   cetirizine (ZYRTEC) 10 MG tablet Take 1 tablet (10 mg total) by mouth daily. 30 tablet 11   escitalopram (LEXAPRO) 10 MG tablet 1 tab by mouth daily 30 tablet 4   famotidine (PEPCID) 20 MG tablet Take 1 tablet (20 mg total) by mouth 2 (two) times daily. 30 tablet 3   fluticasone (FLONASE) 50 MCG/ACT nasal spray Place 1 spray into both nostrils daily. 16 g 0   levocetirizine (XYZAL ALLERGY 24HR) 5 MG tablet Take 1 tablet (5 mg total) by mouth every evening. 30 tablet 11   levonorgestrel (MIRENA) 20 MCG/24HR IUD 1 each by Intrauterine route once. Placed 06/2020 by PHD:  5 year     montelukast (SINGULAIR) 10 MG tablet Take 1 tablet (10 mg total) by mouth at bedtime. 30 tablet 11   No current facility-administered medications for this visit.   No Known Allergies   Review of Systems: All systems reviewed and negative except where noted in HPI.    CT Chest W Contrast  Result Date: 07/15/2023 CLINICAL DATA:  Hemoptysis. History of right parathyroid adenoma excision. EXAM: CT CHEST WITH CONTRAST TECHNIQUE: Multidetector CT imaging of the chest was performed during intravenous contrast administration. RADIATION DOSE REDUCTION: This exam was performed according to the departmental dose-optimization program which includes automated exposure control, adjustment of the mA and/or kV according to patient size and/or use of iterative reconstruction technique. CONTRAST:  75mL OMNIPAQUE IOHEXOL 350 MG/ML SOLN COMPARISON:  None Available. FINDINGS: Cardiovascular: Heart size is normal. No significant pericardial effusion. Normal caliber of the thoracic aorta. Mediastinum/Nodes: Esophagus is unremarkable. Surgical clips along the posterior aspect of the right thyroid. Small amount of soft tissue in the anterior mediastinal has the configuration typical for thymus. However, there is a small amount of soft tissue along the right upper anterior mediastinum on image 44/3 that is indeterminate. This soft  tissue roughly measures 1.9 x 0.9 cm. Limited evaluation of the axillary regions. Lungs/Pleura: Trachea and mainstem bronchi are patent. Both lungs are clear. No airspace disease or lung consolidation. No pleural effusions. Upper Abdomen: Images of the upper abdomen are unremarkable. Musculoskeletal: No acute bone abnormality. IMPRESSION: 1. No acute abnormality in the chest. 2. Indeterminate soft tissue in the right upper anterior mediastinum. This could represent atypical thymic tissue but indeterminate. Recommend 3 to 6 month follow-up chest CT with IV contrast to evaluate for stability. These results will be called to the ordering clinician or representative by the Radiologist Assistant, and communication documented in the PACS or [employer]. Electronically Signed   By: Richarda Overlie M.D.   On: 07/15/2023 12:49    Physical Exam: BP 122/70   Pulse 63   Ht 5\' 9"  (1.753 m)   Wt 212 lb (96.2 kg)   BMI 31.31 kg/m  Constitutional: Pleasant,well-developed, African female in no acute distress. HEENT: Normocephalic and atraumatic. Conjunctivae are normal. No scleral icterus. Neck supple.  Cardiovascular: Normal rate, regular rhythm.  Pulmonary/chest:  Effort normal and breath sounds normal. No wheezing, rales or rhonchi. Abdominal: Soft, nondistended, nontender. Bowel sounds active throughout. There are no masses palpable. No hepatomegaly. Extremities: no edema Lymphadenopathy: No cervical adenopathy noted. Neurological: Alert and oriented to person place and time. Skin: Skin is warm and dry. No rashes noted. Psychiatric: Normal mood and affect. Behavior is normal.  CBC    Component Value Date/Time   WBC 9.0 03/10/2023 1401   RBC 4.57 03/10/2023 1401   HGB 13.3 03/10/2023 1401   HGB 12.9 10/13/2022 1110   HCT 41.0 03/10/2023 1401   HCT 39.3 10/13/2022 1110   PLT 270 03/10/2023 1401   PLT 234 10/13/2022 1110   MCV 89.7 03/10/2023 1401   MCV 90 10/13/2022 1110   MCH 29.1 03/10/2023 1401    MCHC 32.4 03/10/2023 1401   RDW 12.3 03/10/2023 1401   RDW 11.9 10/13/2022 1110   LYMPHSABS 2.3 03/10/2023 1401   LYMPHSABS 2.4 10/13/2022 1110   MONOABS 0.7 03/10/2023 1401   EOSABS 0.1 03/10/2023 1401   EOSABS 0.2 10/13/2022 1110   BASOSABS 0.0 03/10/2023 1401   BASOSABS 0.0 10/13/2022 1110    CMP     Component Value Date/Time   NA 134 (L) 03/10/2023 1401   NA 138 10/13/2022 1110   K 3.7 03/10/2023 1401   CL 102 03/10/2023 1401   CO2 24 03/10/2023 1401   GLUCOSE 102 (H) 03/10/2023 1401   BUN 8 03/10/2023 1401   BUN 7 10/13/2022 1110   CREATININE 0.74 03/10/2023 1401   CALCIUM 9.2 03/10/2023 1401   PROT 6.3 10/13/2022 1110   ALBUMIN 4.1 10/13/2022 1110   AST 19 10/13/2022 1110   ALT 19 10/13/2022 1110   ALKPHOS 63 10/13/2022 1110   BILITOT 1.1 10/13/2022 1110   GFRNONAA >60 03/10/2023 1401   GFRAA 121 05/21/2020 1100       Latest Ref Rng & Units 03/10/2023    2:01 PM 10/13/2022   11:10 AM 10/04/2021   11:46 AM  CBC EXTENDED  WBC 4.0 - 10.5 K/uL 9.0  4.5  6.4   RBC 3.87 - 5.11 MIL/uL 4.57  4.38  4.76   Hemoglobin 12.0 - 15.0 g/dL 16.1  09.6  04.5   HCT 36.0 - 46.0 % 41.0  39.3  41.6   Platelets 150 - 400 K/uL 270  234  277   NEUT# 1.7 - 7.7 K/uL 5.8  1.6  3.0   Lymph# 0.7 - 4.0 K/uL 2.3  2.4  2.9       ASSESSMENT AND PLAN: 47 year old female with both typical and atypical GERD symptoms, with some improvement with famotidine and dietary modifications.  She has had some other abnormalities noted during her workup, to include a vocal cord lesion, esophageal diverticulum and mediastinal soft tissue mass.  It is difficult to discern if any of these abnormalities are related to any of her symptoms. The esophageal diverticulum is in an unusual location.  Based on my interpretation, it seems that the diverticulum does seem to be in the same vicinity as the mediastinal mass.  The diverticulum is most likely a traction diverticulum based on its location, and the  mediastinal mass would be a possible explanation for a diverticulum and that location.  I think it is unlikely that the diverticulum is causing symptoms of dysphagia, heartburn or bloody sputum. An EGD is warranted to further evaluate for evidence of reflux, to further assess her anatomy, and possibly further visualize the diverticulum. Agree with recommendation for  repeat CT in 3 months to reassess the mediastinal lesion.  If persistent or enlarging, would consider referral to cardiothoracic surgery for further evaluation.  We discussed the pathophysiology of GERD and the principles of GERD management to include lifestyle modifications  such as dietary discretion (avoidance of alcohol, tobacco, caffeinated and carbonated beverages, spicy/greasy foods, citrus, peppermint/chocolate), weight loss if applicable, head of bed elevation andconsuming last meal of day within 3 hours of bedtime; pharmacologic options to include PPIs, H2RAs and OTC antacids; and finally surgical or endoscopic fundoplication. For now, I recommend she just continue her daily famotidine.  If there is significant esophagitis noted on her EGD, she will need to take PPI.  We also discussed colon cancer screening, specifically discussing differences between colonoscopy and stool based tests.  She would be a candidate for either one.  After going over the details of the colonoscopy and the stool-based test, the patient elected to proceed with Cologuard.  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   Newell Frater E. Tomasa Rand, MD [facility]  I spent a total of 45 minutes reviewing the patient's medical record, interviewing and examining the patient, discussing her diagnosis and management of her condition going forward, and documenting in the medical record   Julieanne Manson, MD

## 2023-08-19 ENCOUNTER — Encounter (INDEPENDENT_AMBULATORY_CARE_PROVIDER_SITE_OTHER): Payer: Self-pay | Admitting: Otolaryngology

## 2023-08-19 ENCOUNTER — Ambulatory Visit (INDEPENDENT_AMBULATORY_CARE_PROVIDER_SITE_OTHER): Payer: No Typology Code available for payment source | Admitting: Otolaryngology

## 2023-08-19 VITALS — BP 124/77 | HR 72

## 2023-08-19 DIAGNOSIS — J383 Other diseases of vocal cords: Secondary | ICD-10-CM

## 2023-08-19 DIAGNOSIS — J343 Hypertrophy of nasal turbinates: Secondary | ICD-10-CM

## 2023-08-19 DIAGNOSIS — J3489 Other specified disorders of nose and nasal sinuses: Secondary | ICD-10-CM

## 2023-08-19 DIAGNOSIS — R09A2 Foreign body sensation, throat: Secondary | ICD-10-CM

## 2023-08-19 DIAGNOSIS — D380 Neoplasm of uncertain behavior of larynx: Secondary | ICD-10-CM

## 2023-08-19 DIAGNOSIS — J9859 Other diseases of mediastinum, not elsewhere classified: Secondary | ICD-10-CM

## 2023-08-19 DIAGNOSIS — K219 Gastro-esophageal reflux disease without esophagitis: Secondary | ICD-10-CM

## 2023-08-19 DIAGNOSIS — R1310 Dysphagia, unspecified: Secondary | ICD-10-CM

## 2023-08-19 DIAGNOSIS — J342 Deviated nasal septum: Secondary | ICD-10-CM

## 2023-08-19 DIAGNOSIS — R490 Dysphonia: Secondary | ICD-10-CM

## 2023-08-19 DIAGNOSIS — R0981 Nasal congestion: Secondary | ICD-10-CM

## 2023-08-19 DIAGNOSIS — J3089 Other allergic rhinitis: Secondary | ICD-10-CM

## 2023-08-19 DIAGNOSIS — J381 Polyp of vocal cord and larynx: Secondary | ICD-10-CM

## 2023-08-19 DIAGNOSIS — R0982 Postnasal drip: Secondary | ICD-10-CM

## 2023-08-19 DIAGNOSIS — R042 Hemoptysis: Secondary | ICD-10-CM

## 2023-08-19 MED ORDER — FLUTICASONE PROPIONATE 50 MCG/ACT NA SUSP: 1.0000 | Freq: Two times a day (BID) | NASAL | 12 refills | Status: AC

## 2023-08-19 MED ORDER — LEVOCETIRIZINE DIHYDROCHLORIDE 5 MG PO TABS: 5.0000 mg | ORAL_TABLET | Freq: Every evening | ORAL | 11 refills | Status: AC

## 2023-08-19 NOTE — Patient Instructions (Signed)
-   see Thoracic surgery  - proceed with EGD - see Pulmonary  Return in 2-3 months to discuss vocal fold surgery

## 2023-08-19 NOTE — Progress Notes (Signed)
ENT PROGRESS NOTE:  Update 05/22/23:  She returns for f/u after swallow study. She reports continued intermittent hemoptysis with small amount blood she coughs up intermittently. She describes intermittent chest tightness. She started medications we discussed last time. She feels Famotidine helps. She scheduled her GI appt, in early Fall (Sept/Oct). She had swallow study and it was overall unremarkable. She feels her sx are somewhat better now.   Initial HPI 05/01/23   Reason for Consult: throat discomfort /other   HPI: Casey Mayo is an 47 y.o. female with a recent visit to ED for heartburn like sx, here for evaluation of her throat discomfort, throat pain, hemoptysis and dysphonia/dysphagia.  She is here for throat discomfort, neck tightness/pressure (worse at night). She feels that it is hard to swallow spicy foods, meats/bread and large pills.  No smoking or heavy drinking hx, no weight loss or night sweats. She has chronic nasal congestion. She was started on Famotidine 40 mg 03/09/23 but the pills were hard to swallow and she scaled down to twice a week (takes it as needed). She uses TUMS once a day and it helps. She sounds raspy almost all the time. She denies coughing or choking with foods or liquids. She denies dyspnea.   She had right parathyroid adenoma removed 04/04/2021  Records Reviewed:  OP note - had right inferior parathyroid adenoma removed  ED visit note - negative workup for chest pain/cardiac Prescribed Famotidine and Zyrtec     Past Medical History:  Diagnosis Date   GERD (gastroesophageal reflux disease)    Hyperparathyroidism (HCC) 04/2019   Right inferior parathyroid adenoma    Past Surgical History:  Procedure Laterality Date   MASS EXCISION Right 04/04/2021   Procedure: EXCISION RIGHT PARATHYROID ADENOMA;  Surgeon: Abigail Miyamoto, MD;  Location: [facility];  Service: General;  Laterality: Right;    Family History  Problem Relation Age of Onset   Heart disease  Mother        Possibly CHF   Asthma Daughter    Asthma Son    Breast cancer Neg Hx     Social History:  reports that she has never smoked. She has never been exposed to tobacco smoke. She has never used smokeless tobacco. She reports that she does not drink alcohol and does not use drugs.  Allergies: No Known Allergies  Medications: I have reviewed the patient's current medications.    The PMH, PSH, Medications, Allergies, and SH were reviewed and updated.  ROS: Constitutional: Negative for fever, weight loss and weight gain. Cardiovascular: Negative for chest pain and dyspnea on exertion. Respiratory: Is not experiencing shortness of breath at rest. Gastrointestinal: Negative for nausea and vomiting. Neurological: Negative for headaches. Psychiatric: The patient is not nervous/anxious  PHYSICAL EXAM:  Exam: General: Well-developed, well-nourished Communication and Voice: raspy and low-pitch  Respiratory Respiratory effort: Equal inspiration and expiration without stridor Cardiovascular Peripheral Vascular: Warm extremities with equal color/perfusion Eyes: No nystagmus with equal extraocular motion bilaterally Neuro/Psych/Balance: Patient oriented to person, place, and time; Appropriate mood and affect; Gait is intact with no imbalance; Cranial nerves I-XII are intact Head and Face Inspection: Normocephalic and atraumatic without mass or lesion Palpation: Facial skeleton intact without bony stepoffs Salivary Glands: No mass or tenderness Facial Strength: Facial motility symmetric and full bilaterally ENT Pinna: External ear intact and fully developed External canal: Canal is patent with intact skin Tympanic Membrane: Clear and mobile External Nose: No scar or anatomic deformity Internal Nose: previously seen deviated nasal septum,  mid septal spur on the left, and caudal spur on the right side, right side is nearly completely obstructed 2/2 NSD and ITH, b/l ITH. Mucosal  edema.  Lips, Teeth, and gums: Mucosa and teeth intact and viable TMJ: No pain to palpation with full mobility Oral cavity/oropharynx: No erythema or exudate, no lesions present Nasopharynx: No mass or lesion with intact mucosa Hypopharynx: Intact mucosa without pooling of secretions Larynx See summary of procedure note below - previously seen submucosal cystic lesion along the mid true VF with decreased mucosal wave at the lesion site, overall stable from prior exam. Moderate post-cricoid edema and pachydermia  Neck Neck and Trachea: Midline trachea without mass or lesion Thyroid: No mass or nodularity Lymphatics: No lymphadenopathy  Procedure:  Summary of Video-Laryngeal-Stroboscopy: previously seen submucosal lesion along the mid aspect of the left vocal fold, on dorsal surface of mild 1/3 smooth in appearance, decreased mucosal wave only at the site of the lesion due to bulkiness of lesion, counter-coup thickening along the right VF, closure is incomplete 2/2 lesion. Sublgottis and proximal trachea are clear. No blood or clot, no mucosal irregularities. Moderate post-cricoid edema/pachydermia c/w GERD/LPR  Preoperative diagnosis: hoarseness + LV VF lesions and GERD LPR  Postoperative diagnosis:   same  Procedure: Flexible fiberoptic laryngoscopy with stroboscopy (56213)  Surgeon: Ashok Croon, MD  Anesthesia: Topical lidocaine and Afrin  Complications: None  Condition is stable throughout exam  Indications and consent:   The patient presents to the clinic with hoarseness. All the risks, benefits, and potential complications were reviewed with the patient preoperatively and informed verbal consent was obtained.  Procedure: The patient was seated upright in the exam chair.   Topical lidocaine and Afrin were applied to the nasal cavity. After adequate anesthesia had occurred, the flexible telescope was passed into the nasal cavity. The nasopharynx was patent without mass or  lesion. The scope was passed behind the soft palate and directed toward the base of tongue. The base of tongue was visualized and was symmetric with no apparent masses or abnormal appearing tissue. There were no signs of a mass or pooling of secretions in the piriform sinuses. The supraglottic structures were normal.  The true vocal cords are mobile. The medial edges were with lesion on the left and thickening of the edge on the right opposite of lesion. Closure was incomplete. Periodicity present. The mucosal wave and amplitude were decreased at the site of a lesion. There is moderate interarytenoid pachydermia and post cricoid edema. The mucosa appears healthy.   The laryngoscope was then slowly withdrawn and the patient tolerated the procedure well. There were no complications or blood loss.  Studies Reviewed: CT chest FINDINGS: Cardiovascular: Heart size is normal. No significant pericardial effusion. Normal caliber of the thoracic aorta.   Mediastinum/Nodes: Esophagus is unremarkable. Surgical clips along the posterior aspect of the right thyroid. Small amount of soft tissue in the anterior mediastinal has the configuration typical for thymus. However, there is a small amount of soft tissue along the right upper anterior mediastinum on image 44/3 that is indeterminate. This soft tissue roughly measures 1.9 x 0.9 cm. Limited evaluation of the axillary regions.   Lungs/Pleura: Trachea and mainstem bronchi are patent. Both lungs are clear. No airspace disease or lung consolidation. No pleural effusions.   Upper Abdomen: Images of the upper abdomen are unremarkable.   Musculoskeletal: No acute bone abnormality.   IMPRESSION: 1. No acute abnormality in the chest. 2. Indeterminate soft tissue in the right upper  anterior mediastinum. This could represent atypical thymic tissue but indeterminate. Recommend 3 to 6 month follow-up chest CT with IV contrast to evaluate for  stability.   surgical path 04/04/2021  SURGICAL PATHOLOGY CASE: [id number] PATIENT: Orion Crook Surgical Pathology Report     Clinical History: Right parathyroid adenoma ([facility])   FINAL MICROSCOPIC DIAGNOSIS:  A. PARATHYROID, RIGHT INFERIOR, PARATHYROIDECTOMY: -  Hypercellular parathyroid tissue (1.9 g) consistent with adenoma   MBS summary 05/20/23 Aside from swallow initiation delay to pyriform sinus with thin liquids, patient's oropharyngeal swallow is [facility]. Oropharyngeal structures were all functioning properly. No penetration, aspiration or pharyngeal residuals observed during any phase of the swallow. 13mm barium tablet taken with thin liquid barium transited through pharynx and esophagus without observed difficulty or delay. Upper esophagus did appear somewhat dilated. Patient had an esophagram just prior to this MBS and so esophageal sweep was not performed. SLP denied any globus sensation or swallowing during or after PO intake of any of the barium consistencies.   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 See key image   Assessment/Plan: Encounter Diagnoses  Name Primary?   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  She endorses dysphonia to voice and difficulties with swallowing mostly to solids and pills.  She denies coughing or choking on foods or food regurgitation.  No prior swallow studies or upper endoscopy with GI.  She reports she sometimes coughs up  blood small amount and episodes are infrequent.  She endorses active heartburn symptoms including burning in her upper chest had negative cardiac workup in the ER.  She denies dyspnea, denies weight loss, tolerating regular diet at this point, no history of pneumonia.   Her exam today demonstrated left mid vocal fold lesion that we will better assess with video stroboscopy when she returns.  Will order modified barium swallow and esophagram to better assess her trouble with swallowing differential diagnosis includes oropharyngeal versus esophageal dysphagia.  Based on the history alone it is unclear where the source of bleeding is coming from when she coughs up small amounts of blood-tinged secretions although I suspect this could be related to low volume nosebleed other possibilities include GI sources and pulmonary sources although my suspicion of those 2 is very low.    We will refer her to see GI for EGD, and review her swallow study once it is done.  The most likely explanation of her trouble swallowing is uncontrolled reflux in the setting of active heartburn symptoms.  Will start her on famotidine 20 mg alginate therapy.  I also suspect that she has chronic nasal congestion and seasonal or environmental allergies based on sensation of mucus and postnasal drainage.  She is using Flonase already and we will prescribe an antihistamine to add to that.   - RTC 3 weeks for videostrobe - will decide If and when do do DML bx excision for tissue diagnosis  - GI referral for  EGD - MBS/esophagram for dysphagia  - Famotidine 20 mg BID and alginate therapy, discussed diet and lifestyle changes to minimize reflux  - Cetirizine/Flonase for nasal congestion and PND/allergies - will consider chest imaging and referral to pulm in the future if she continues to have hemoptysis -She reports nasal obstruction and difficulties with breathing through her nose and has evidence of left-sided septal spur and right-sided  nasal septal deviation with significant obstruction of both nasal passages.  If fails medical management of that we will consider surgical options including septoplasty and inferior turbinate reduction.   -She had elevated high blood pressure today (SBP 178) and does not have any history of hypertension, we discussed with the patient that she needs to have it checked with her primary care physician, she indicated she will have a follow-up appointment in 2 days and will discuss that with her primary care provider.   Update 05/22/23 She continues to have hemoptysis. MBS/esophagram reviewed with her today, normal OP swallow on MBS and evidence of small (tiny in my opinion) outpouching of the thoracic esophagus. She has f/u with GI scheduled in a few weeks and I encouraged her to discuss esophagram findings with GI. We did a videostrobe today and it revealed submucosal lesion on the left side (see summary of the findings above). I do not think this is a source of her hemoptysis. We did not see any obvious source of epistaxis or source of bleeding in her mouth oropharynx today. No prior chest imaging. She checked her BP and it was normal twice, no indication for medication at this time.   - see Pulmonary for blood in sputum - referral sent  - schedule CT chest with contrast - see GI doctor as scheduled (Fall) - ask your GI physician to review esophagram results (swallow study) - return in 3 months for f/u - will schedule DML and microflap excision of the left vocal fold cyst which has appearance of likely a benign lesion (submucosal). Not urgent at this time and will complete workup for hemoptysis prior to scheduling her surgery.   Update 08/19/23 she returns for f/u see summary of multiple complaints below. Still has low-volume hemoptysis daily when she coughs. Scheduled to see Pulm in a few weeks, and saw GI, with planned EGD. Tolerating regular diet and we previously reviewed MBS she had done 05/20/23 which  demonstrated normal swallow and esophagram with small pulsion diverticulum at the upper thoracic esophagus. CT chest ordered for workup of hemoptysis with soft tissue approximately 2 cm in the area of right upper mediastinum with recommendation for follow-up imaging in 3 to 6 months based on radiology report.  Longstanding chronic dysphagia and throat tightness/globus sensation -We discussed results of her swallow tests and there were no issues noted with swallow on MBS.  Suspect GERD LPR is the main contributing etiology for her globus and throat tightness symptoms -Continue Pepcid 20 mg twice daily -Diet and lifestyle changes to minimize reflux -Trial of reflux Gourmet  2.  Chronic hemoptysis, low-volume but persistent occurring daily -Scheduled to see pulm will proceed as scheduled -Evaluated by GI with pending EGD will proceed as scheduled -On my exam today again no evidence of source of bleeding in upper airway and no history or evidence of epistaxis based on my evaluation, requires additional assessment by pulm and GI to determine the source of hemoptysis  3.  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  Chronic nasal congestion postnasal drainage suspected environmental allergies -We discussed again today the importance of medical management with antihistamine and Flonase -Continue Flonase 2 puffs bilateral nares twice daily refill sent -Xyzal 5 mg daily -Continue Singulair 10 mg daily -Consider nasal saline rinses  5.  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  I discussed management including observation and removal using microflap technique.  I also discussed risks and benefits of the procedure and postop recovery which will require 2 days of voice rest.  Patient has a toddler at home and is hesitant to proceed with surgery due to a need for complete voice rest to optimize recovery and healing -We decided to have her complete all other consultations and return in 2 months to discuss surgery further     Ashok Croon, MD Otolaryngology [facility] Phone: [phone] Fax: [phone]    08/21/2023, 6:57 AM

## 2023-08-21 ENCOUNTER — Ambulatory Visit (INDEPENDENT_AMBULATORY_CARE_PROVIDER_SITE_OTHER): Payer: No Typology Code available for payment source | Admitting: Otolaryngology

## 2023-08-25 ENCOUNTER — Other Ambulatory Visit: Payer: Self-pay | Admitting: Psychology

## 2023-08-27 ENCOUNTER — Encounter: Payer: Self-pay | Admitting: Gastroenterology

## 2023-08-27 ENCOUNTER — Ambulatory Visit (AMBULATORY_SURGERY_CENTER): Payer: Self-pay | Admitting: Gastroenterology

## 2023-08-27 VITALS — BP 138/68 | HR 68 | Temp 97.2°F | Resp 11 | Ht 69.0 in | Wt 212.0 lb

## 2023-08-27 DIAGNOSIS — K2950 Unspecified chronic gastritis without bleeding: Secondary | ICD-10-CM

## 2023-08-27 DIAGNOSIS — K219 Gastro-esophageal reflux disease without esophagitis: Secondary | ICD-10-CM

## 2023-08-27 MED ORDER — SODIUM CHLORIDE 0.9 % IV SOLN: 500.0000 mL | Freq: Once | INTRAVENOUS | Status: DC

## 2023-08-27 NOTE — Op Note (Signed)
[facility] Patient Name: Casey Mayo Procedure Date: 08/27/2023 10:20 AM MRN: [id number] Endoscopist: Lorin Picket E. Tomasa Rand , MD, [id number] Age: 47 Referring MD:  Date of Birth: 02-26-1976 Gender: Female Account #: [id number] Procedure:                Upper GI endoscopy Indications:              Esophageal reflux symptoms that persist despite                            appropriate therapy, Abnormal UGI series (proximal                            esophageal diverticulum) Medicines:                Monitored Anesthesia Care Procedure:                Pre-Anesthesia Assessment:                           - Prior to the procedure, a History and Physical                            was performed, and patient medications and                            allergies were reviewed. The patient's tolerance of                            previous anesthesia was also reviewed. The risks                            and benefits of the procedure and the sedation                            options and risks were discussed with the patient.                            All questions were answered, and informed consent                            was obtained. Prior Anticoagulants: The patient has                            taken no anticoagulant or antiplatelet agents. ASA                            Grade Assessment: II - A patient with mild systemic                            disease. After reviewing the risks and benefits,                            the patient was deemed in satisfactory condition to  undergo the procedure.                           After obtaining informed consent, the endoscope was                            passed under direct vision. Throughout the                            procedure, the patient's blood pressure, pulse, and                            oxygen saturations were monitored continuously. The                            GIF [id number] #[id number] was  introduced through the                            mouth, and advanced to the second part of duodenum.                            The upper GI endoscopy was accomplished without                            difficulty. The patient tolerated the procedure                            well. Scope In: Scope Out: Findings:                 The examined portions of the nasopharynx,                            oropharynx and larynx were normal.                           The Z-line was irregular. Biopsies were taken with                            a cold forceps for histology. Estimated blood loss                            was minimal.                           Three islands of salmon-colored mucosa were present                            at 36-37 cm. No other visible abnormalities were                            present. Biopsies were taken with a cold forceps                            for histology. Estimated blood loss was minimal.  The exam of the esophagus was otherwise normal.                            Four biopsies were obtained in the mid esophagus                            and in the distal esophagus with cold forceps for                            histology. Estimated blood loss was minimal.                           A 3 cm hiatal hernia was present.                           The entire examined stomach was normal.                           The examined duodenum was normal. Complications:            No immediate complications. Estimated Blood Loss:     Estimated blood loss was minimal. Impression:               - The examined portions of the nasopharynx,                            oropharynx and larynx were normal.                           - Z-line irregular. Biopsied.                           - Salmon-colored mucosa. Biopsied.                           - 3 cm hiatal hernia.                           - Normal stomach.                           - Normal  examined duodenum.                           - Four biopsies were obtained in the mid esophagus                            and in the distal esophagus.                           - No esophageal diverticulum seen on exam despite a                            meticulous exam of the proximal esophagus. Recommendation:           - Patient has a contact number available for  emergencies. The signs and symptoms of potential                            delayed complications were discussed with the                            patient. Return to normal activities tomorrow.                            Written discharge instructions were provided to the                            patient.                           - Resume previous diet.                           - Continue present medications.                           - Await pathology results. Avionna Bower E. Tomasa Rand, MD 08/27/2023 10:46:43 AM This report has been signed electronically.

## 2023-08-27 NOTE — Progress Notes (Signed)
Called to room to assist during endoscopic procedure.  Patient ID and intended procedure confirmed with present staff. Received instructions for my participation in the procedure from the performing physician.  

## 2023-08-27 NOTE — Progress Notes (Signed)
History and Physical Interval Note:  08/27/2023 10:18 AM  Casey Mayo  has presented today for endoscopic procedure(s), with the diagnosis of  Encounter Diagnosis  Name Primary?   Gastroesophageal reflux disease, unspecified whether esophagitis present Yes  .  The various methods of evaluation and treatment have been discussed with the patient and/or family. After consideration of risks, benefits and other options for treatment, the patient has consented to  the endoscopic procedure(s).   The patient's history has been reviewed, patient examined, no change in status, stable for endoscopic procedure(s).  I have reviewed the patient's chart and labs.  Questions were answered to the patient's satisfaction.     Bilaal Leib E. Tomasa Rand, MD [facility]

## 2023-08-27 NOTE — Progress Notes (Signed)
Vss nad trans to pacu 

## 2023-08-27 NOTE — Patient Instructions (Addendum)

## 2023-08-28 ENCOUNTER — Telehealth: Payer: Self-pay | Admitting: *Deleted

## 2023-08-28 NOTE — Telephone Encounter (Signed)
  Follow up Call-     08/27/2023    9:41 AM  Call back number  Post procedure Call Back phone  # [phone]  Permission to leave phone message Yes     Patient questions:  Do you have a fever, pain , or abdominal swelling? No. Pain Score  0 *  Have you tolerated food without any problems? Yes.    Have you been able to return to your normal activities? Yes.    Do you have any questions about your discharge instructions: Diet   No. Medications  No. Follow up visit  No.  Do you have questions or concerns about your Care? No.  Actions: * If pain score is 4 or above: No action needed, pain <4.

## 2023-08-31 ENCOUNTER — Encounter: Payer: Self-pay | Admitting: Gastroenterology

## 2023-08-31 ENCOUNTER — Ambulatory Visit: Payer: Self-pay | Admitting: Internal Medicine

## 2023-08-31 ENCOUNTER — Encounter: Payer: Self-pay | Admitting: Internal Medicine

## 2023-08-31 VITALS — BP 138/84 | HR 66 | Ht 69.0 in | Wt 207.0 lb

## 2023-08-31 DIAGNOSIS — Q396 Congenital diverticulum of esophagus: Secondary | ICD-10-CM

## 2023-08-31 DIAGNOSIS — K219 Gastro-esophageal reflux disease without esophagitis: Secondary | ICD-10-CM

## 2023-08-31 DIAGNOSIS — J302 Other seasonal allergic rhinitis: Secondary | ICD-10-CM

## 2023-08-31 DIAGNOSIS — J387 Other diseases of larynx: Secondary | ICD-10-CM

## 2023-08-31 DIAGNOSIS — J9859 Other diseases of mediastinum, not elsewhere classified: Secondary | ICD-10-CM

## 2023-08-31 LAB — SURGICAL PATHOLOGY

## 2023-08-31 MED ORDER — MONTELUKAST SODIUM 10 MG PO TABS: 10.0000 mg | ORAL_TABLET | Freq: Every day | ORAL | 11 refills | Status: AC

## 2023-08-31 NOTE — Patient Instructions (Addendum)
EVeryone's shoes left at door when come in Check out the downstairs for moisture, mold, mildew or dust Dust and vacuum weekly entire home

## 2023-08-31 NOTE — Progress Notes (Signed)
Subjective:    Patient ID: Casey Mayo, female   DOB: [birth year], 47 y.o.   MRN: [id number]   HPI   Dysphagia:  After she underwent EGD and colonoscopy, she states she was told everything looked fine and she should try swallowing her pills whole.  She has not had issues swallowing food since July.  She has been swallowing pills whole now or 3 days without issue.  Awaiting biopsy results from Last Thursday with EGD/colonoscopy.    2.  Unresolved issues:    Laryngeal growth:  reportedly, she is going back to ENT in December to re examine and decide if will have the lesion removed.  She has been told it appears benign.    Thoracic esophageal diverticulum and Mediastinal mass.  GI feels this is at same level as small mediastinal mass and may have traction from the mass.  She has an appt with thoracic surgeon, Dr. Dorris Fetch.  She was to have a repeat CT scan of chest in December through March and likely decided by Dr. Dorris Fetch.  3.  GERD:  Denies any symptoms of reflux now.  Has HOB elevated.  Does not eat after 6 p.m.  4.  Allergies:  Performs Neti pot every morning followed by nasal fluticasone.  Bilateral ear itching and clearing of throat.  At night, nasal congestion can get worse and has been occasionally using another nasal fluticasone.  She sometimes misses her xyzal, but has not paid attention to whether that's when she has more symptoms. Continues to spit up blood, but does not feel it comes from her throat.  States she spits up daily.  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  Has been to dentist since all of this has occurred and she does have dental issues.    Has lived in home for 4 years.   Symptoms started in spring of 2024.   Nasal congestion generally gets worse after being home for 1-2 hours in downstairs area. Wall to wall carpeting upstairs in bedroom.  Dusts weekly.  Vacuums every other week.   Denies moisture issues, mold or mildew or musty smell. No plants in  house now--she threw them out recently and seemed to help  No animals. Lots of trees around home. She and one child take shoes off at door. Husband and other 3 children wear shoes throughout home. Windows closed in home.   States [facility] came to home in August and inspected home and did not find any concerns.   5.  Panic Disorder:  missed her last appt with SBT, [facility].  She is stable on Escitalpram  Current Meds  Medication Sig   escitalopram (LEXAPRO) 10 MG tablet 1 tab by mouth daily   famotidine (PEPCID) 20 MG tablet Take 1 tablet (20 mg total) by mouth 2 (two) times daily.   fluticasone (FLONASE) 50 MCG/ACT nasal spray Place 1 spray into both nostrils 2 (two) times daily.   levocetirizine (XYZAL ALLERGY 24HR) 5 MG tablet Take 1 tablet (5 mg total) by mouth every evening.   levonorgestrel (MIRENA) 20 MCG/24HR IUD 1 each by Intrauterine route once. Placed 06/2020 by PHD:  5 year  Not clear if taking Montelukast--called pharmacy and only filled once in August No Known Allergies   Review of Systems    Objective:   BP 138/84 (BP Location: Left Arm, Patient Position: Sitting, Cuff Size: Normal)   Pulse 66   Ht 5\' 9"  (1.753 m)   Wt 207 lb (93.9 kg)  BMI 30.57 kg/m   Physical Exam NAD HEENT:   Mild conjunctival injection. TMs pearly gray.   Canals clear and without flaking. Nasal mucosa erythematous and mildly swollen.  Clear nasal secretions Mild cobbling of posterior pharynx. Periodontal disease with recession of gingiva with mild inflammation of gingiva at central lower incisors.    Neck:  Supple, No adenopathy Chest:  CTA CV:  RRR with normal radial pulses ABd:  S, NT, No HSM or mass, + BS LE:  No edema.  Assessment & Plan   Laryngeal cyst or growth:  as per ENT.  Reportedly a decision to be made in December regarding surgery to remove.  2.  Thoracic esophageal diverticulum, possible related to mediastinal mass:  Thoracic surgery to assess.    3.  GERD:   Seemingly controlled now.  Await results of tissue biopsy from EGD/colonoscopy last week with GI  4.  Allergies:  was not taking Singulair since August.  Not clear she understood what is was for.  To restart and get a better idea if improves symptoms.  5.  Panic Disorder:  controlled

## 2023-09-02 ENCOUNTER — Institutional Professional Consult (permissible substitution): Payer: No Typology Code available for payment source | Admitting: Pulmonary Disease

## 2023-09-03 ENCOUNTER — Other Ambulatory Visit: Payer: Self-pay | Admitting: Psychology

## 2023-09-03 NOTE — Progress Notes (Signed)
DAP note  Client name: Casey Mayo Therapist name: Letta Moynahan [name] Date: 07/07/2023  DATA: "Thank you for seeing me today. I feel like I just needed to talk to someone. I am having some trouble with my daughter. She just is not like me and I am not sure there is much that can be done about that. I was raised to be strong and to take care of what I need to take care of. She is not like that, she is a Emergency planning/management officer. She has been making some questionable choices and it is worrying me sick. I have another daughter in the home and she is more like me and the boy is younger but he is more like me as well. I do not really worry about them. My husband is great, he is supportive of me. He does not have a lot to do with the kids but that is our culture. 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  ASSESSMENT: The client presents as a strong independent women. She has had many life experiences who make her who she is. She talked about her family with love and care but made no hesitation that she is tough love. She cares deeply for them and wants the best for them and for her that is knowing she has to be both to be the best parent that she can be. She spoke kindly of her husband and was very clear about the expectations that her culture has for men. This session was very enjoyable getting to know her and learn about her culture. Looking forward to future sessions.   PLAN: Continue with therapy once per month with a goal of attending one per month. Begin taking a moment of mindfulness once per day and work on box breathing to reduce stress. Client reports that during these times her stress in at and 8 and would like to bring this down to a 2. Today she reports it at a 6. A CCA will be done next session.

## 2023-09-22 ENCOUNTER — Encounter: Payer: Self-pay | Admitting: Thoracic Surgery (Cardiothoracic Vascular Surgery)

## 2023-09-22 ENCOUNTER — Institutional Professional Consult (permissible substitution) (INDEPENDENT_AMBULATORY_CARE_PROVIDER_SITE_OTHER): Payer: Self-pay | Admitting: Thoracic Surgery (Cardiothoracic Vascular Surgery)

## 2023-09-22 ENCOUNTER — Other Ambulatory Visit: Payer: Self-pay | Admitting: Thoracic Surgery (Cardiothoracic Vascular Surgery)

## 2023-09-22 VITALS — BP 145/84 | HR 69 | Resp 18 | Ht 69.0 in | Wt 210.0 lb

## 2023-09-22 DIAGNOSIS — J9859 Other diseases of mediastinum, not elsewhere classified: Secondary | ICD-10-CM

## 2023-09-22 NOTE — Progress Notes (Signed)
PCP is Julieanne Manson, MD Referring Provider is Ashok Croon, MD  Chief Complaint  Patient presents with   Mediastinal Mass    Chest CT 07/03/23    HPI:Ms. Blecha is sent for consultation regarding a soft tissue density in her anterior mediastinum.  Tonilynn Dix is a 47 year old woman with a history significant for hyperparathyroidism, parathyroidectomy, reflux, esophageal diverticulum, and depression.  Underwent a right inferior parathyroidectomy by Dr. Magnus Ivan in 2022.  In June she was having some difficulty swallowing.  She had a barium esophagram which showed a small diverticulum in the proximal esophagus.  Her swallowing issues have since resolved.  She says she does occasionally spit up blood after clearing her throat.  She had a CT of the chest to evaluate that.  There was a 1.9 x 0.9 cm soft tissue density in the anterior mediastinum adjacent to the innominate vein near the SVC junction.  No airway or lung abnormalities.   She denies double vision.  Does complain of some fatigue.  No chest pain or shortness of breath.   Past Medical History:  Diagnosis Date   Depression    GERD (gastroesophageal reflux disease)    Hyperparathyroidism (HCC) 04/2019   Right inferior parathyroid adenoma    Past Surgical History:  Procedure Laterality Date   MASS EXCISION Right 04/04/2021   Procedure: EXCISION RIGHT PARATHYROID ADENOMA;  Surgeon: Abigail Miyamoto, MD;  Location: [facility];  Service: General;  Laterality: Right;    Family History  Problem Relation Age of Onset   Heart disease Mother        Possibly CHF   Asthma Daughter    Asthma Son    Breast cancer Neg Hx    Colon cancer Neg Hx    Colon polyps Neg Hx    Pancreatic cancer Neg Hx    Prostate cancer Neg Hx    Rectal cancer Neg Hx    Stomach cancer Neg Hx     Social History Social History   Tobacco Use   Smoking status: Never    Passive exposure: Never   Smokeless tobacco: Never  Vaping Use   Vaping status:  Never Used  Substance Use Topics   Alcohol use: No   Drug use: No    Current Outpatient Medications  Medication Sig Dispense Refill   escitalopram (LEXAPRO) 10 MG tablet 1 tab by mouth daily 30 tablet 4   famotidine (PEPCID) 20 MG tablet Take 1 tablet (20 mg total) by mouth 2 (two) times daily. 30 tablet 3   fluticasone (FLONASE) 50 MCG/ACT nasal spray Place 1 spray into both nostrils 2 (two) times daily. 16 g 12   levocetirizine (XYZAL ALLERGY 24HR) 5 MG tablet Take 1 tablet (5 mg total) by mouth every evening. 30 tablet 11   levonorgestrel (MIRENA) 20 MCG/24HR IUD 1 each by Intrauterine route once. Placed 06/2020 by PHD:  5 year     montelukast (SINGULAIR) 10 MG tablet Take 1 tablet (10 mg total) by mouth at bedtime. 30 tablet 11   No current facility-administered medications for this visit.    No Known Allergies  Review of Systems  Constitutional:  Positive for fatigue. Negative for activity change and unexpected weight change.  HENT:  Positive for trouble swallowing (Resolved).   Eyes:  Negative for visual disturbance.  Respiratory:  Negative for shortness of breath.   Cardiovascular:  Negative for chest pain and leg swelling.  All other systems reviewed and are negative.   BP (!) 145/84  Pulse 69   Resp 18   Ht 5\' 9"  (1.753 m)   Wt 210 lb (95.3 kg)   SpO2 98% Comment: RA  BMI 31.01 kg/m  Physical Exam Vitals reviewed.  Constitutional:      Appearance: Normal appearance.  HENT:     Head: Normocephalic and atraumatic.  Eyes:     General: No scleral icterus.    Extraocular Movements: Extraocular movements intact.  Cardiovascular:     Rate and Rhythm: Normal rate and regular rhythm.     Heart sounds: Normal heart sounds. No murmur heard. Pulmonary:     Effort: Pulmonary effort is normal. No respiratory distress.     Breath sounds: Normal breath sounds. No wheezing.  Musculoskeletal:     Cervical back: Neck supple.  Lymphadenopathy:     Cervical: No cervical  adenopathy.  Skin:    General: Skin is warm and dry.  Neurological:     General: No focal deficit present.     Mental Status: She is alert and oriented to person, place, and time.     Cranial Nerves: No cranial nerve deficit.     Motor: No weakness.    Diagnostic Tests: CT CHEST WITH CONTRAST   TECHNIQUE: Multidetector CT imaging of the chest was performed during intravenous contrast administration.   RADIATION DOSE REDUCTION: This exam was performed according to the departmental dose-optimization program which includes automated exposure control, adjustment of the mA and/or kV according to patient size and/or use of iterative reconstruction technique.   CONTRAST:  75mL OMNIPAQUE IOHEXOL 350 MG/ML SOLN   COMPARISON:  None Available.   FINDINGS: Cardiovascular: Heart size is normal. No significant pericardial effusion. Normal caliber of the thoracic aorta.   Mediastinum/Nodes: Esophagus is unremarkable. Surgical clips along the posterior aspect of the right thyroid. Small amount of soft tissue in the anterior mediastinal has the configuration typical for thymus. However, there is a small amount of soft tissue along the right upper anterior mediastinum on image 44/3 that is indeterminate. This soft tissue roughly measures 1.9 x 0.9 cm. Limited evaluation of the axillary regions.   Lungs/Pleura: Trachea and mainstem bronchi are patent. Both lungs are clear. No airspace disease or lung consolidation. No pleural effusions.   Upper Abdomen: Images of the upper abdomen are unremarkable.   Musculoskeletal: No acute bone abnormality.   IMPRESSION: 1. No acute abnormality in the chest. 2. Indeterminate soft tissue in the right upper anterior mediastinum. This could represent atypical thymic tissue but indeterminate. Recommend 3 to 6 month follow-up chest CT with IV contrast to evaluate for stability.   These results will be called to the ordering clinician  or representative by the Radiologist Assistant, and communication documented in the PACS or [employer].     Electronically Signed   By: Richarda Overlie M.D.   On: 07/15/2023 12:49 I personally reviewed the CT images.  There is a approximately 1 x 1.9 cm smoothly marginated soft tissue density in the right anterior upper mediastinum adjacent to the innominate vein near the SVC junction.  Impression: Remedy Torner is a 47 year old woman with a history significant for hyperparathyroidism, parathyroidectomy, reflux, esophageal diverticulum, and depression.    Anterior mediastinal "mass"-well-circumscribed soft tissue density adjacent to innominate vein.  Could represent a focal area of thymic hyperplasia or a small thymoma.  Also could be parathyroid in that location.  Given her history, I think that needs to be a consideration.  Will check calcium and parathyroid hormone levels.  I do  not think the nodule warrants surgical resection currently but does necessitate follow-up.  Esophageal diverticulum-has a small traction diverticulum Not in relation to the anterior mediastinal nodule.  Swallowing issues have resolved but occasionally "spits up blood."  There is a good chance that his coming from their nasopharynx.  Might consider referral to gastroenterology for endoscopy if that persists.  Plan: Check calcium and parathyroid hormone levels Return in 4 months with CT chest with contrast (6 months from previous scan).  Loreli Slot, MD [facility] [phone]

## 2023-10-15 ENCOUNTER — Ambulatory Visit: Payer: Self-pay | Admitting: Pulmonary Disease

## 2023-10-15 ENCOUNTER — Encounter: Payer: Self-pay | Admitting: Pulmonary Disease

## 2023-10-15 VITALS — BP 134/79 | HR 67 | Ht 69.0 in | Wt 218.2 lb

## 2023-10-15 DIAGNOSIS — Q396 Congenital diverticulum of esophagus: Secondary | ICD-10-CM

## 2023-10-15 NOTE — Progress Notes (Signed)
@Patient  ID: Casey Mayo, female    DOB: Jun 14, 1976, 48 y.o.   MRN: [id number]  Chief Complaint  Patient presents with   Consult    Referring provider: Ashok Croon, MD  HPI:   47 y.o. woman whom are seen for evaluation of spitting up of blood.  ENT note reviewed.  Cardiothoracic note reviewed.  Most recent PCP note reviewed.  She reports expectoration of blood starting in June 2024.  She denies any cough.  No hemoptysis.  She does not cough it up.  She spits up phlegm streaked with blood.  Not very high volume.  But usually every morning.  She does endorse intermittent nosebleeds but denies any significant nosebleeds recently.  She has a sensation of discomfort occasionally pain in her neck or throat.  She has been seen by ENT.  Exam without any abnormality.  An esophagram was performed that showed a esophageal diverticulum above the clavicle.  She underwent a CT chest 06/2023 in the midst of the symptoms that on my review interpretation reveals no intraparenchymal abnormality or reason for hemoptysis.  Again she denies hemoptysis.  Did demonstrate a small mediastinal soft tissue mass.  Subsequent seen by cardiothoracic surgery.  They plan surveillance with upcoming CT scan in the spring.  No issues with breathing.  No cough.  She does have nasal congestion allergies.  She uses montelukast, antihistamine, Flonase.  She thinks it helps.  Encouraged her to continue these medications.  Prior chest imaging most recently 02/2023 chest x-ray personally reviewed interpreted as clear lungs bilaterally.  Questionaires / Pulmonary Flowsheets:   ACT:      No data to display          MMRC:     No data to display          Epworth:      No data to display          Tests:   FENO:  No results found for: "NITRICOXIDE"  PFT:     No data to display          WALK:      No data to display          Imaging: Personally reviewed and as per EMR and discussion in this  note No results found.  Lab Results: Personally reviewed CBC    Component Value Date/Time   WBC 9.0 03/10/2023 1401   RBC 4.57 03/10/2023 1401   HGB 13.3 03/10/2023 1401   HGB 12.9 10/13/2022 1110   HCT 41.0 03/10/2023 1401   HCT 39.3 10/13/2022 1110   PLT 270 03/10/2023 1401   PLT 234 10/13/2022 1110   MCV 89.7 03/10/2023 1401   MCV 90 10/13/2022 1110   MCH 29.1 03/10/2023 1401   MCHC 32.4 03/10/2023 1401   RDW 12.3 03/10/2023 1401   RDW 11.9 10/13/2022 1110   LYMPHSABS 2.3 03/10/2023 1401   LYMPHSABS 2.4 10/13/2022 1110   MONOABS 0.7 03/10/2023 1401   EOSABS 0.1 03/10/2023 1401   EOSABS 0.2 10/13/2022 1110   BASOSABS 0.0 03/10/2023 1401   BASOSABS 0.0 10/13/2022 1110    BMET    Component Value Date/Time   NA 134 (L) 03/10/2023 1401   NA 138 10/13/2022 1110   K 3.7 03/10/2023 1401   CL 102 03/10/2023 1401   CO2 24 03/10/2023 1401   GLUCOSE 102 (H) 03/10/2023 1401   BUN 8 03/10/2023 1401   BUN 7 10/13/2022 1110   CREATININE 0.74 03/10/2023 1401   CALCIUM  9.2 03/10/2023 1401   GFRNONAA >60 03/10/2023 1401   GFRAA 121 05/21/2020 1100    BNP No results found for: "BNP"  ProBNP No results found for: "PROBNP"  Specialty Problems       Pulmonary Problems   Laryngeal mass    No Known Allergies  Immunization History  Administered Date(s) Administered   Influenza-Unspecified 07/17/2023   PFIZER(Purple Top)SARS-COV-2 Vaccination 01/14/2020, 02/04/2020   Tdap 04/23/2016    Past Medical History:  Diagnosis Date   Depression    GERD (gastroesophageal reflux disease)    Hyperparathyroidism (HCC) 04/2019   Right inferior parathyroid adenoma    Tobacco History: Social History   Tobacco Use  Smoking Status Never   Passive exposure: Never  Smokeless Tobacco Never   Counseling given: Not Answered   Continue to not smoke  Outpatient Encounter Medications as of 10/15/2023  Medication Sig   escitalopram (LEXAPRO) 10 MG tablet 1 tab by mouth daily    famotidine (PEPCID) 20 MG tablet Take 1 tablet (20 mg total) by mouth 2 (two) times daily.   levocetirizine (XYZAL ALLERGY 24HR) 5 MG tablet Take 1 tablet (5 mg total) by mouth every evening.   levonorgestrel (MIRENA) 20 MCG/24HR IUD 1 each by Intrauterine route once. Placed 06/2020 by PHD:  5 year   montelukast (SINGULAIR) 10 MG tablet Take 1 tablet (10 mg total) by mouth at bedtime.   fluticasone (FLONASE) 50 MCG/ACT nasal spray Place 1 spray into both nostrils 2 (two) times daily. (Patient not taking: Reported on 10/15/2023)   No facility-administered encounter medications on file as of 10/15/2023.     Review of Systems  Review of Systems  No chest pain with exertion.  No orthopnea or PND.  Comprehensive review of systems otherwise negative. Physical Exam  BP 134/79   Pulse 67   Ht 5\' 9"  (1.753 m)   Wt 218 lb 3.2 oz (99 kg)   SpO2 98% Comment: room air  BMI 32.22 kg/m   Wt Readings from Last 5 Encounters:  10/15/23 218 lb 3.2 oz (99 kg)  09/22/23 210 lb (95.3 kg)  08/31/23 207 lb (93.9 kg)  08/27/23 212 lb (96.2 kg)  07/29/23 212 lb (96.2 kg)    BMI Readings from Last 5 Encounters:  10/15/23 32.22 kg/m  09/22/23 31.01 kg/m  08/31/23 30.57 kg/m  08/27/23 31.31 kg/m  07/29/23 31.31 kg/m     Physical Exam General: In the chair, no acute distress Eyes: EOMI, icterus Neck: Supple, no JVP Pulmonary: Clear, no work of breathing Abdomen: Nondistended, sounds present Cardiovascular: Warm, no edema MSK: No synovitis, no joint effusion Neuro: Normal gait, no weakness Psych: Normal mood, full affect   Assessment & Plan:   Expectoration of sputum mixed with blood: Description of spitting up blood and expectoration.  She states she spits it up.  No real cough.  Present intermittently for many months.  Appreciated CT chest 06/2023.  Images reviewed, no source of hemoptysis seen.  Possibly nasopharyngeal in origin.  Endorses intermittent nosebleeds.  High suspicion for  source of esophageal diverticulum given she points in that general area just above the clavicles in terms of discomfort, possible inflammation and leaking of capillaries leading to blood in the throat which she expectorates.  Again she denies any coughing.  No further imaging or workup recommended.  For the esophagus, I recommended ongoing follow-up with GI specialist.  Anterior mediastinal soft tissue density: Established with cardiothoracic surgery.  Has planned follow-up.  Encouraged to continue to follow  with cardiothoracic surgery.  Return if symptoms worsen or fail to improve.   Karren Burly, MD 10/15/2023   This appointment required 60 minutes of patient care (this includes precharting, chart review, review of results, face-to-face care, etc.).

## 2023-10-16 ENCOUNTER — Encounter: Payer: Self-pay | Admitting: Internal Medicine

## 2023-11-06 ENCOUNTER — Other Ambulatory Visit: Payer: Self-pay

## 2023-11-06 MED ORDER — FAMOTIDINE 20 MG PO TABS: 20.0000 mg | ORAL_TABLET | Freq: Two times a day (BID) | ORAL | 3 refills | Status: DC

## 2023-11-11 ENCOUNTER — Ambulatory Visit (INDEPENDENT_AMBULATORY_CARE_PROVIDER_SITE_OTHER): Payer: Self-pay | Admitting: Otolaryngology

## 2023-11-11 ENCOUNTER — Encounter (INDEPENDENT_AMBULATORY_CARE_PROVIDER_SITE_OTHER): Payer: Self-pay | Admitting: Otolaryngology

## 2023-11-11 VITALS — BP 152/85 | HR 61

## 2023-11-11 DIAGNOSIS — J383 Other diseases of vocal cords: Secondary | ICD-10-CM

## 2023-11-11 DIAGNOSIS — R490 Dysphonia: Secondary | ICD-10-CM

## 2023-11-11 DIAGNOSIS — J381 Polyp of vocal cord and larynx: Secondary | ICD-10-CM

## 2023-11-11 DIAGNOSIS — R0982 Postnasal drip: Secondary | ICD-10-CM

## 2023-11-11 DIAGNOSIS — J9859 Other diseases of mediastinum, not elsewhere classified: Secondary | ICD-10-CM

## 2023-11-11 DIAGNOSIS — R042 Hemoptysis: Secondary | ICD-10-CM

## 2023-11-11 DIAGNOSIS — J343 Hypertrophy of nasal turbinates: Secondary | ICD-10-CM

## 2023-11-11 DIAGNOSIS — J387 Other diseases of larynx: Secondary | ICD-10-CM

## 2023-11-11 DIAGNOSIS — R0981 Nasal congestion: Secondary | ICD-10-CM

## 2023-11-11 DIAGNOSIS — K219 Gastro-esophageal reflux disease without esophagitis: Secondary | ICD-10-CM

## 2023-11-11 NOTE — Progress Notes (Signed)
 ENT PROGRESS NOTE:  Update 11/11/23  Discussed the use of AI scribe software for clinical note transcription with the patient, who gave verbal consent to proceed.  History of Present Illness   The patient, with a history of nasal congestion/hemoptysis and a left vocal fold cyst, presents for f/u after seeing multiple specialists for her workup. They report that she continues to have blood in her sputum from time to time, sometimes present daily, in small amounts, approximately the size of a dime. The patient also reports occasional nosebleeds, occurring approximately once every one to two months. They deny any changes in her baseline voice.   The patient has seen multiple specialists, including Pulmonary, GI, and Thoracic Surgery, for this issue. A CT chest which showed mediastinal mass/soft tissue was reviewed by CT surgery, with the plan to repeat imaging in a few months. The patient was scoped by GI. No significant findings were reported.   The patient has been using Flonase  and Zyrtec  for nasal congestion and postnasal drainage. They also report heartburn, which is managed with Pepcid . The patient has a cyst on the vocal cord, but does not wish to pursue surgery to remove it.   Records Reviewed Thoracic Surgery 09/22/23  HPI:Ms. Montalvo is sent for consultation regarding a soft tissue density in her anterior mediastinum.   Raynee Tufte is a 48 year old woman with a history significant for hyperparathyroidism, parathyroidectomy, reflux, esophageal diverticulum, and depression.  Underwent a right inferior parathyroidectomy by Dr. Lucienne Ryder in 2022.   In June she was having some difficulty swallowing.  She had a barium esophagram which showed a small diverticulum in the proximal esophagus.  Her swallowing issues have since resolved.  She says she does occasionally spit up blood after clearing her throat.  She had a CT of the chest to evaluate that.  There was a 1.9 x 0.9 cm soft tissue density in the  anterior mediastinum adjacent to the innominate vein near the SVC junction.  No airway or lung abnormalities.    She denies double vision.  Does complain of some fatigue.  No chest pain or shortness of breath.  IMPRESSION: 1. No acute abnormality in the chest. 2. Indeterminate soft tissue in the right upper anterior mediastinum. This could represent atypical thymic tissue but indeterminate. Recommend 3 to 6 month follow-up chest CT with IV contrast to evaluate for stability.   These results will be called to the ordering clinician or representative by the Radiologist Assistant, and communication documented in the PACS or [employer].     Electronically Signed   By: Elene Griffes M.D.   On: 07/15/2023 12:49 I personally reviewed the CT images.  There is a approximately 1 x 1.9 cm smoothly marginated soft tissue density in the right anterior upper mediastinum adjacent to the innominate vein near the SVC junction.   Impression: Laniesha Stromme is a 48 year old woman with a history significant for hyperparathyroidism, parathyroidectomy, reflux, esophageal diverticulum, and depression.     Anterior mediastinal "mass"-well-circumscribed soft tissue density adjacent to innominate vein.  Could represent a focal area of thymic hyperplasia or a small thymoma.  Also could be parathyroid  in that location.  Given her history, I think that needs to be a consideration.   Will check calcium  and parathyroid  hormone levels.   I do not think the nodule warrants surgical resection currently but does necessitate follow-up.   Esophageal diverticulum-has a small traction diverticulum Not in relation to the anterior mediastinal nodule.  Swallowing issues have resolved but  occasionally "spits up blood."  There is a good chance that his coming from their nasopharynx.  Might consider referral to gastroenterology for endoscopy if that persists.   Plan: Check calcium  and parathyroid  hormone levels Return in 4  months with CT chest with contrast (6 months from previous scan).  Pulmonary Note 10/15/23 48 y.o. woman whom are seen for evaluation of spitting up of blood.  ENT note reviewed.  Cardiothoracic note reviewed.  Most recent PCP note reviewed.   She reports expectoration of blood starting in June 2024.  She denies any cough.  No hemoptysis.  She does not cough it up.  She spits up phlegm streaked with blood.  Not very high volume.  But usually every morning.  She does endorse intermittent nosebleeds but denies any significant nosebleeds recently.  She has a sensation of discomfort occasionally pain in her neck or throat.  She has been seen by ENT.  Exam without any abnormality.  An esophagram was performed that showed a esophageal diverticulum above the clavicle.  She underwent a CT chest 06/2023 in the midst of the symptoms that on my review interpretation reveals no intraparenchymal abnormality or reason for hemoptysis.  Again she denies hemoptysis.  Did demonstrate a small mediastinal soft tissue mass.  Subsequent seen by cardiothoracic surgery.  They plan surveillance with upcoming CT scan in the spring.   No issues with breathing.  No cough.  She does have nasal congestion allergies.  She uses montelukast , antihistamine, Flonase .  She thinks it helps.  Encouraged her to continue these medications.   Prior chest imaging most recently 02/2023 chest x-ray personally reviewed interpreted as clear lungs bilaterally.  Expectoration of sputum mixed with blood: Description of spitting up blood and expectoration.  She states she spits it up.  No real cough.  Present intermittently for many months.  Appreciated CT chest 06/2023.  Images reviewed, no source of hemoptysis seen.  Possibly nasopharyngeal in origin.  Endorses intermittent nosebleeds.  High suspicion for source of esophageal diverticulum given she points in that general area just above the clavicles in terms of discomfort, possible inflammation and  leaking of capillaries leading to blood in the throat which she expectorates.  Again she denies any coughing.  No further imaging or workup recommended.  For the esophagus, I recommended ongoing follow-up with GI specialist.   Anterior mediastinal soft tissue density: Established with cardiothoracic surgery.  Has planned follow-up.  Encouraged to continue to follow with cardiothoracic surgery.   GI Office Visit 08/31/23  HPI    Dysphagia:  After she underwent EGD and colonoscopy, she states she was told everything looked fine and she should try swallowing her pills whole.  She has not had issues swallowing food since July.  She has been swallowing pills whole now or 3 days without issue.  Awaiting biopsy results from Last Thursday with EGD/colonoscopy.     2.  Unresolved issues:     Laryngeal growth:  reportedly, she is going back to ENT in December to re examine and decide if will have the lesion removed.  She has been told it appears benign.     Thoracic esophageal diverticulum and Mediastinal mass.  GI feels this is at same level as small mediastinal mass and may have traction from the mass.  She has an appt with thoracic surgeon, Dr. Luna Salinas.  She was to have a repeat CT scan of chest in December through March and likely decided by Dr. Luna Salinas.   3.  GERD:  Denies any symptoms of reflux now.  Has HOB elevated.  Does not eat after 6 p.m.   4.  Allergies:  Performs Neti pot every morning followed by nasal fluticasone .  Bilateral ear itching and clearing of throat.  At night, nasal congestion can get worse and has been occasionally using another nasal fluticasone .  She sometimes misses her xyzal , but has not paid attention to whether that's when she has more symptoms. Continues to spit up blood, but does not feel it comes from her throat.  States she spits up daily.  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  Has been to dentist since all of this has occurred and she does have dental  issues.     Has lived in home for 4 years.   Symptoms started in spring of 2024.   Nasal congestion generally gets worse after being home for 1-2 hours in downstairs area. Wall to wall carpeting upstairs in bedroom.  Dusts weekly.  Vacuums every other week.   Denies moisture issues, mold or mildew or musty smell. No plants in house now--she threw them out recently and seemed to help  No animals. Lots of trees around home. She and one child take shoes off at door. Husband and other 3 children wear shoes throughout home. Windows closed in home.   States [facility] came to home in August and inspected home and did not find any concerns.    5.  Panic Disorder:  missed her last appt with SBT, [facility].  She is stable on Escitalpram  Update 08/19/23:  She returns for f/u after swallow study. She reports continued intermittent hemoptysis with small amount blood she coughs up intermittently. She describes intermittent chest tightness. She started medications we discussed last time. She feels Famotidine  helps. She scheduled her GI appt, in early Fall (Sept/Oct). She had swallow study and it was overall unremarkable. She feels her sx are somewhat better now.   Initial HPI 05/01/23   Reason for Consult: throat discomfort /other   HPI: Laketta Turberville is an 48 y.o. female with a recent visit to ED for heartburn like sx, here for evaluation of her throat discomfort, throat pain, hemoptysis and dysphonia/dysphagia.  She is here for throat discomfort, neck tightness/pressure (worse at night). She feels that it is hard to swallow spicy foods, meats/bread and large pills.  No smoking or heavy drinking hx, no weight loss or night sweats. She has chronic nasal congestion. She was started on Famotidine  40 mg 03/09/23 but the pills were hard to swallow and she scaled down to twice a week (takes it as needed). She uses TUMS once a day and it helps. She sounds raspy almost all the time. She denies coughing or  choking with foods or liquids. She denies dyspnea.   She had right parathyroid  adenoma removed 04/04/2021  Records Reviewed:  OP note - had right inferior parathyroid  adenoma removed  ED visit note - negative workup for chest pain/cardiac Prescribed Famotidine  and Zyrtec      Past Medical History:  Diagnosis Date   Depression    GERD (gastroesophageal reflux disease)    Hyperparathyroidism (HCC) 04/2019   Right inferior parathyroid  adenoma    Past Surgical History:  Procedure Laterality Date   MASS EXCISION Right 04/04/2021   Procedure: EXCISION RIGHT PARATHYROID  ADENOMA;  Surgeon: Oza Blumenthal, MD;  Location: [facility];  Service: General;  Laterality: Right;    Family History  Problem Relation Age of Onset   Heart disease Mother  Possibly CHF   Asthma Daughter    Asthma Son    Breast cancer Neg Hx    Colon cancer Neg Hx    Colon polyps Neg Hx    Pancreatic cancer Neg Hx    Prostate cancer Neg Hx    Rectal cancer Neg Hx    Stomach cancer Neg Hx     Social History:  reports that she has never smoked. She has never been exposed to tobacco smoke. She has never used smokeless tobacco. She reports that she does not drink alcohol and does not use drugs.  Allergies: Not on File  Medications: I have reviewed the patient's current medications.    The PMH, PSH, Medications, Allergies, and SH were reviewed and updated.  ROS: Constitutional: Negative for fever, weight loss and weight gain. Cardiovascular: Negative for chest pain and dyspnea on exertion. Respiratory: Is not experiencing shortness of breath at rest. Gastrointestinal: Negative for nausea and vomiting. Neurological: Negative for headaches. Psychiatric: The patient is not nervous/anxious  PHYSICAL EXAM:  Exam: General: Well-developed, well-nourished Communication and Voice: raspy and low-pitch  Respiratory Respiratory effort: Equal inspiration and expiration without  stridor Cardiovascular Peripheral Vascular: Warm extremities with equal color/perfusion Eyes: No nystagmus with equal extraocular motion bilaterally Neuro/Psych/Balance: Patient oriented to person, place, and time; Appropriate mood and affect; Gait is intact with no imbalance; Cranial nerves I-XII are intact Head and Face Inspection: Normocephalic and atraumatic without mass or lesion Palpation: Facial skeleton intact without bony stepoffs Salivary Glands: No mass or tenderness Facial Strength: Facial motility symmetric and full bilaterally ENT Pinna: External ear intact and fully developed External canal: Canal is patent with intact skin Tympanic Membrane: Clear and mobile External Nose: No scar or anatomic deformity Internal Nose: previously seen deviated nasal septum, mid septal spur on the left, and caudal spur on the right side, Small area of ulcerated mucosa right inferior turbinate, no active epistaxis, no blood or clot.  Lips, Teeth, and gums: Mucosa and teeth intact and viable TMJ: No pain to palpation with full mobility Oral cavity/oropharynx: No erythema or exudate, no lesions present Nasopharynx: No mass or lesion with intact mucosa Hypopharynx: Intact mucosa without pooling of secretions Larynx See summary of procedure note below - previously seen submucosal cystic lesion along the left mid true VF with decreased mucosal wave at the lesion site, overall stable from prior exam. Moderate post-cricoid edema and pachydermia  Neck Neck and Trachea: Midline trachea without mass or lesion Thyroid : No mass or nodularity Lymphatics: No lymphadenopathy  Procedure:  Summary of Video-Laryngeal-Stroboscopy: previously seen submucosal lesion along the mid aspect of the left vocal fold, on dorsal surface of mild 1/3 smooth in appearance, decreased mucosal wave only at the site of the lesion No blood clot or lesion no source of hemoptysis  Moderate post-cricoid edema/pachydermia c/w  GERD/LPR  Preoperative diagnosis: hoarseness + LV VF lesions and GERD LPR  Postoperative diagnosis:   same  Procedure: Flexible fiberoptic laryngoscopy with stroboscopy (11914)  Surgeon: Willette Mudry, MD  Anesthesia: Topical lidocaine  and Afrin  Complications: None  Condition is stable throughout exam  Indications and consent:   The patient presents to the clinic with hoarseness. All the risks, benefits, and potential complications were reviewed with the patient preoperatively and informed verbal consent was obtained.  Procedure: The patient was seated upright in the exam chair.   Topical lidocaine  and Afrin were applied to the nasal cavity. After adequate anesthesia had occurred, the flexible telescope was passed into the nasal cavity.  The nasopharynx was patent without mass or lesion. The scope was passed behind the soft palate and directed toward the base of tongue. The base of tongue was visualized and was symmetric with no apparent masses or abnormal appearing tissue. There were no signs of a mass or pooling of secretions in the piriform sinuses. The supraglottic structures were normal.  The true vocal cords are mobile. The medial edges were with lesion on the left and thickening of the edge on the right opposite of lesion. Closure was incomplete. Periodicity present. The mucosal wave and amplitude were decreased at the site of a lesion. There is moderate interarytenoid pachydermia and post cricoid edema. The mucosa appears healthy.   The laryngoscope was then slowly withdrawn and the patient tolerated the procedure well. There were no complications or blood loss.    PROCEDURE NOTE: nasal endoscopy  Preoperative diagnosis: chronic sinusitis symptoms and hemoptysis   Postoperative diagnosis: same  Procedure: Diagnostic nasal endoscopy (40981)  Surgeon: Artice Last, M.D.  Anesthesia: Topical lidocaine  and Afrin  H&P REVIEW: The patient's history and physical were  reviewed today prior to procedure. All medications were reviewed and updated as well. Complications: None Condition is stable throughout exam Indications and consent: The patient presents with symptoms of chronic sinusitis not responding to previous therapies. All the risks, benefits, and potential complications were reviewed with the patient preoperatively and informed consent was obtained. The time out was completed with confirmation of the correct procedure.   Procedure: The patient was seated upright in the clinic. Topical lidocaine  and Afrin were applied to the nasal cavity. After adequate anesthesia had occurred, the rigid nasal endoscope was passed into the nasal cavity. The nasal mucosa, turbinates, septum, and sinus drainage pathways were visualized bilaterally. This revealed no purulence or significant secretions that might be cultured. There were no polyps or sites of significant inflammation. The mucosa was intact and there was no crusting present. The scope was then slowly withdrawn and the patient tolerated the procedure well. There were no complications or blood loss.   Studies Reviewed: CT chest FINDINGS: Cardiovascular: Heart size is normal. No significant pericardial effusion. Normal caliber of the thoracic aorta.   Mediastinum/Nodes: Esophagus is unremarkable. Surgical clips along the posterior aspect of the right thyroid . Small amount of soft tissue in the anterior mediastinal has the configuration typical for thymus. However, there is a small amount of soft tissue along the right upper anterior mediastinum on image 44/3 that is indeterminate. This soft tissue roughly measures 1.9 x 0.9 cm. Limited evaluation of the axillary regions.   Lungs/Pleura: Trachea and mainstem bronchi are patent. Both lungs are clear. No airspace disease or lung consolidation. No pleural effusions.   Upper Abdomen: Images of the upper abdomen are unremarkable.   Musculoskeletal: No acute bone  abnormality.   IMPRESSION: 1. No acute abnormality in the chest. 2. Indeterminate soft tissue in the right upper anterior mediastinum. This could represent atypical thymic tissue but indeterminate. Recommend 3 to 6 month follow-up chest CT with IV contrast to evaluate for stability.   surgical path 04/04/2021  SURGICAL PATHOLOGY CASE: [id number] PATIENT: Phylisha Villa Surgical Pathology Report     Clinical History: Right parathyroid  adenoma ([facility])   FINAL MICROSCOPIC DIAGNOSIS:  A. PARATHYROID , RIGHT INFERIOR, PARATHYROIDECTOMY: -  Hypercellular parathyroid  tissue (1.9 g) consistent with adenoma   MBS summary 05/20/23 Aside from swallow initiation delay to pyriform sinus with thin liquids, patient's oropharyngeal swallow is [facility]. Oropharyngeal structures were all functioning properly. No penetration, aspiration  or pharyngeal residuals observed during any phase of the swallow. 13mm barium tablet taken with thin liquid barium transited through pharynx and esophagus without observed difficulty or delay. Upper esophagus did appear somewhat dilated. Patient had an esophagram just prior to this MBS and so esophageal sweep was not performed. SLP denied any globus sensation or swallowing during or after PO intake of any of the barium consistencies.   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 See key image    EGD 08/27/23    Assessment/Plan: Encounter Diagnoses  Name Primary?   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  She endorses dysphonia to voice  and difficulties with swallowing mostly to solids and pills.  She denies coughing or choking on foods or food regurgitation.  No prior swallow studies or upper endoscopy with GI.  She reports she sometimes coughs up blood small amount and episodes are infrequent.  She endorses active heartburn symptoms including burning in her upper chest had negative cardiac workup in the ER.  She denies dyspnea, denies weight loss, tolerating regular diet at this point, no history of pneumonia.   Her exam today demonstrated left mid vocal fold lesion that we will better assess with video stroboscopy when she returns.  Will order modified barium swallow and esophagram to better assess her trouble with swallowing differential diagnosis includes oropharyngeal versus esophageal dysphagia.  Based on the history alone it is unclear where the source of bleeding is coming from when she coughs up small amounts of blood-tinged secretions although I suspect this could be related to low volume nosebleed other possibilities include GI sources and pulmonary sources although my suspicion of those 2 is very low.    We will refer her to see GI for EGD, and review her swallow study once it is done.  The most likely explanation of her trouble swallowing is uncontrolled reflux in the setting of active heartburn symptoms.  Will start her on famotidine  20 mg alginate therapy.  I also suspect that she has chronic nasal congestion and seasonal or environmental allergies based on sensation of mucus and postnasal drainage.  She is using Flonase  already and we will prescribe an antihistamine to add to that.   - RTC 3 weeks for videostrobe - will decide If and when do do DML bx excision for tissue diagnosis  - GI referral for EGD - MBS/esophagram for dysphagia  - Famotidine  20 mg BID and alginate therapy, discussed diet and lifestyle changes to minimize reflux  - Cetirizine /Flonase  for nasal congestion and PND/allergies - will consider chest  imaging and referral to pulm in the future if she continues to have hemoptysis -She reports nasal obstruction and difficulties with breathing through her nose and has evidence of left-sided septal spur and right-sided nasal septal deviation with significant obstruction of both nasal passages.  If fails medical management of that we will consider surgical options including septoplasty and inferior turbinate reduction.   -She had elevated high blood pressure today (SBP 178) and does not have any  history of hypertension, we discussed with the patient that she needs to have it checked with her primary care physician, she indicated she will have a follow-up appointment in 2 days and will discuss that with her primary care provider.   Update 05/22/23 She continues to have hemoptysis. MBS/esophagram reviewed with her today, normal OP swallow on MBS and evidence of small (tiny in my opinion) outpouching of the thoracic esophagus. She has f/u with GI scheduled in a few weeks and I encouraged her to discuss esophagram findings with GI. We did a videostrobe today and it revealed submucosal lesion on the left side (see summary of the findings above). I do not think this is a source of her hemoptysis. We did not see any obvious source of epistaxis or source of bleeding in her mouth oropharynx today. No prior chest imaging. She checked her BP and it was normal twice, no indication for medication at this time.   - see Pulmonary for blood in sputum - referral sent  - schedule CT chest with contrast - see GI doctor as scheduled (Fall) - ask your GI physician to review esophagram results (swallow study) - return in 3 months for f/u - will schedule DML and microflap excision of the left vocal fold cyst which has appearance of likely a benign lesion (submucosal). Not urgent at this time and will complete workup for hemoptysis prior to scheduling her surgery.   Update 08/19/23 she returns for f/u see summary of multiple  complaints below. Still has low-volume hemoptysis daily when she coughs. Scheduled to see Pulm in a few weeks, and saw GI, with planned EGD. Tolerating regular diet and we previously reviewed MBS she had done 05/20/23 which demonstrated normal swallow and esophagram with small pulsion diverticulum at the upper thoracic esophagus. CT chest ordered for workup of hemoptysis with soft tissue approximately 2 cm in the area of right upper mediastinum with recommendation for follow-up imaging in 3 to 6 months based on radiology report.  Longstanding chronic dysphagia and throat tightness/globus sensation -We discussed results of her swallow tests and there were no issues noted with swallow on MBS.  Suspect GERD LPR is the main contributing etiology for her globus and throat tightness symptoms -Continue Pepcid  20 mg twice daily -Diet and lifestyle changes to minimize reflux -Trial of reflux Gourmet  2.  Chronic hemoptysis, low-volume but persistent occurring daily -Scheduled to see pulm will proceed as scheduled -Evaluated by GI with pending EGD will proceed as scheduled -On my exam today again no evidence of source of bleeding in upper airway and no history or evidence of epistaxis based on my evaluation, requires additional assessment by pulm and GI to determine the source of hemoptysis  3.  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  Chronic nasal congestion postnasal drainage suspected environmental allergies -We discussed again today the importance of medical management with antihistamine and Flonase  -Continue Flonase  2 puffs bilateral nares twice daily refill sent -Xyzal  5 mg  daily -Continue Singulair  10 mg daily -Consider nasal saline rinses  5.  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  I discussed management including observation and removal using microflap technique.  I also discussed risks and benefits of the procedure and postop recovery which will require 2 days of voice rest.  Patient has a toddler at home and is hesitant to proceed with surgery due to a need for complete voice rest to optimize recovery and healing -We decided to have her complete all other consultations and return in 2 months to discuss surgery further   Update 11/11/23 Assessment and Plan    Hemoptysis Intermittent spitting of blood, approximately dime-sized, occurring every few days and daily at times. No active bleeding observed during examination and no sources identified on exam today (we performed evaluation of her mouth, oropharynx, bilateral nasal endoscopy and flexible scope exam/videostrobe. Previous evaluations by pulmonary, GI, and thoracic surgery were unremarkable as well. Likely source is minor nasal bleeding vs other non-ENT pathology. Significant anxiety about the presence of blood. - Continue Flonase  2 puffs b/l nares BID and Zyrtec  10 mg daily for nasal congestion and postnasal drainage - Use saline sprays and Vaseline to prevent nasal dryness - Advise to return if the amount of blood increases significantly or if new symptoms develop - if she experiences significant amount of hemoptysis I advised her to go to ED  Nasal Congestion Chronic nasal congestion managed with Flonase  and Zyrtec . No active nasal bleeding observed during examination. 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 No significant symptoms or changes in voice reported. Elective surgery discussed but she does not wish to pursue it.  - Monitor for any changes in voice or symptoms - Advise to return if symptoms worsen or if surgery is desired  Gastroesophageal Reflux Disease (GERD) Heartburn. No evidence of GERD-related bleeding. Inquired about fasting and its impact on heartburn. Reassured that fasting is likely safe and may not exacerbate reflux. - Continue Pepcid  and Reflux Gourmet - Advised small meals and avoid eating large meals before sleep   Follow-up - Repeat CT chest in the spring as advised by thoracic surgery - and see Thoracic surgery after CT - Follow-up on an as-needed basis.         Artice Last, MD Otolaryngology [facility] Phone: [phone] Fax: [phone]    11/11/2023, 2:39 PM

## 2023-11-18 ENCOUNTER — Ambulatory Visit: Payer: Self-pay | Admitting: Internal Medicine

## 2023-11-18 ENCOUNTER — Encounter: Payer: Self-pay | Admitting: Internal Medicine

## 2023-11-18 VITALS — BP 120/80 | HR 65 | Resp 16 | Ht 69.0 in | Wt 212.0 lb

## 2023-11-18 DIAGNOSIS — E213 Hyperparathyroidism, unspecified: Secondary | ICD-10-CM

## 2023-11-18 DIAGNOSIS — R7303 Prediabetes: Secondary | ICD-10-CM

## 2023-11-18 DIAGNOSIS — K449 Diaphragmatic hernia without obstruction or gangrene: Secondary | ICD-10-CM

## 2023-11-18 DIAGNOSIS — Z1231 Encounter for screening mammogram for malignant neoplasm of breast: Secondary | ICD-10-CM

## 2023-11-18 DIAGNOSIS — J9859 Other diseases of mediastinum, not elsewhere classified: Secondary | ICD-10-CM

## 2023-11-18 DIAGNOSIS — E010 Iodine-deficiency related diffuse (endemic) goiter: Secondary | ICD-10-CM

## 2023-11-18 DIAGNOSIS — H547 Unspecified visual loss: Secondary | ICD-10-CM

## 2023-11-18 DIAGNOSIS — Z0000 Encounter for general adult medical examination without abnormal findings: Secondary | ICD-10-CM

## 2023-11-18 MED ORDER — CALCIUM CITRATE 250 MG PO TABS: ORAL_TABLET | ORAL | Status: AC

## 2023-11-18 MED ORDER — VITAMIN D3 25 MCG (1000 UT) PO CAPS: 1000.0000 [IU] | ORAL_CAPSULE | Freq: Every day | ORAL | Status: AC

## 2023-11-18 NOTE — Progress Notes (Signed)
 Subjective:    Patient ID: Casey Mayo, female   DOB: 1976-08-19, 48 y.o.   MRN: [id number]   HPI  CPE without pap  1.  Pap:  Had pap at [facility] where IUD placed.  States was reported as normal.    2.  Mammogram:  Last in 04/02/23 and normal.    3.  Osteoprevention:  History of hyperparathyroidism, S/P parathyroid  adenoma excision.  Not much in way of dairy daily and no calcium , Vit D supplementation since history of elevated Ca.  Walks 15-20 minutes 3 times weekly  4.  Guaiac Cards/FIT:  Last in 2021 and negative for blood.   5.  Colonoscopy:  Never.  Had EGD in 07/2023 for dysphagia.  Small hiatal hernia.  Also noted to have small esophageal diverticulum.    6.  Immunizations:  Has not had COVID vaccination this year. Immunization History  Administered Date(s) Administered   Influenza-Unspecified 07/17/2023   PFIZER(Purple Top)SARS-COV-2 Vaccination 01/14/2020, 02/04/2020   Tdap 04/23/2016     7.  Glucose/Cholesterol:  Last A1C for prediabetes checked 09/2022 and 6.0%.  Cholesterol has been fine in past Lipid Panel     Component Value Date/Time   CHOL 149 04/07/2022 0927   TRIG 43 04/07/2022 0927   HDL 45 04/07/2022 0927   CHOLHDL 3.0 Ratio 01/23/2010 2146   VLDL 6 01/23/2010 2146   LDLCALC 94 04/07/2022 0927   LABVLDL 10 04/07/2022 0927     Current Meds  Medication Sig   escitalopram  (LEXAPRO ) 10 MG tablet 1 tab by mouth daily   famotidine  (PEPCID ) 20 MG tablet Take 1 tablet (20 mg total) by mouth 2 (two) times daily. (Patient taking differently: Take 40 mg by mouth daily.)   fluticasone  (FLONASE ) 50 MCG/ACT nasal spray Place 1 spray into both nostrils 2 (two) times daily.   levocetirizine (XYZAL  ALLERGY 24HR) 5 MG tablet Take 1 tablet (5 mg total) by mouth every evening.   levonorgestrel (MIRENA) 20 MCG/24HR IUD 1 each by Intrauterine route once. Placed 06/2020 by PHD:  5 year   montelukast  (SINGULAIR ) 10 MG tablet Take 1 tablet (10 mg total) by  mouth at bedtime.   No Known Allergies  Past Medical History:  Diagnosis Date   Chronic hoarseness 2024   Multifactorial:  GERD, allergies, laryngeal mass, esophageal diverticulum.   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   Past Surgical History:  Procedure Laterality Date   MASS EXCISION Right 04/04/2021   Procedure: EXCISION RIGHT PARATHYROID  ADENOMA;  Surgeon: Oza Blumenthal, MD;  Location: [facility];  Service: General;  Laterality: Right;   Family History  Problem Relation Age of Onset   Heart disease Mother        Possibly CHF   Asthma Daughter    Asthma Son     Social History   Socioeconomic History   Marital status: Married    Spouse name: Ibra Catalfamo   Number of children: 4   Years of education: 5   Highest education level: Not on file  Occupational History   Occupation: Hair dresser   Occupation: hair stylist  Tobacco Use   Smoking status: Never    Passive exposure: Never   Smokeless tobacco: Never  Vaping  Use   Vaping status: Never Used  Substance and Sexual Activity   Alcohol use: No   Drug use: No   Sexual activity: Yes    Birth control/protection: I.U.D.  Other Topics Concern   Not on file  Social History Narrative   Lives at home with husband, 3 youngest children   90 yo daughter still lives in [place].   Social Drivers of Health   Financial Resource Strain: Low Risk  (11/18/2023)   Overall Financial Resource Strain (CARDIA)    Difficulty of Paying Living Expenses: Not hard at all  Food Insecurity: No Food Insecurity (11/18/2023)   Hunger Vital Sign    Worried About Running Out of Food in the Last Year: Never true    Ran Out of Food in the Last Year: Never true  Transportation Needs:  No Transportation Needs (11/18/2023)   PRAPARE - Administrator, Civil Service (Medical): No    Lack of Transportation (Non-Medical): No  Physical Activity: Insufficiently Active (04/04/2019)   Exercise Vital Sign    Days of Exercise per Week: 7 days    Minutes of Exercise per Session: 20 min  Stress: No Stress Concern Present (09/29/2018)   [employer] of Occupational Health - Occupational Stress Questionnaire    Feeling of Stress : Only a little  Social Connections: Unknown (03/11/2022)   Received from [facility]   Social Network    Social Network: Not on file  Intimate Partner Violence: Not At Risk (11/18/2023)   Humiliation, Afraid, Rape, and Kick questionnaire    Fear of Current or Ex-Partner: No    Emotionally Abused: No    Physically Abused: No    Sexually Abused: No      Review of Systems  HENT:  Positive for dental problem (sensitive teeth.  Goes to dental clinic every 3 months.). Negative for postnasal drip (Feels allergies controlled).        Still spitting up blood, likely from nose or mouth.    Eyes:  Negative for visual disturbance (Last check in 2023.  Wears glasses.amb).  Respiratory:  Negative for shortness of breath.   Cardiovascular:  Negative for chest pain, palpitations and leg swelling (Sometimes feet swell).      Objective:   BP 120/80 (BP Location: Right Arm, Patient Position: Sitting, Cuff Size: Normal)   Pulse 65   Resp 16   Ht 5\' 9"  (1.753 m)   Wt 212 lb (96.2 kg)   BMI 31.31 kg/m   Physical Exam HENT:     Head: Normocephalic and atraumatic.     Right Ear: Tympanic membrane, ear canal and external ear normal.     Left Ear: Tympanic membrane, ear canal and external ear normal.     Nose: Nose normal.     Mouth/Throat:     Mouth: Mucous membranes are moist.     Pharynx: Oropharynx is clear.  Eyes:     Extraocular Movements: Extraocular movements intact.     Conjunctiva/sclera: Conjunctivae normal.      Pupils: Pupils are equal, round, and reactive to light.     Comments: Discs sharp  Neck:     Thyroid : Thyromegaly present. No thyroid  mass.  Cardiovascular:     Rate and Rhythm: Normal rate and regular rhythm.     Heart sounds: S1 normal and S2 normal. No murmur heard.    No friction rub. No S3 or S4 sounds.     Comments: No carotid bruits.  Carotid, radial,  femoral, DP and PT pulses normal and equal.   Pulmonary:     Effort: Pulmonary effort is normal.     Breath sounds: Normal breath sounds and air entry.  Chest:  Breasts:    Right: No mass, nipple discharge or skin change.     Left: No mass, nipple discharge or skin change.  Abdominal:     General: Bowel sounds are normal.     Palpations: Abdomen is soft. There is no hepatomegaly, splenomegaly or mass.     Tenderness: There is no abdominal tenderness.     Hernia: No hernia is present.  Genitourinary:    Comments: Deferred as already performed with [facility] Musculoskeletal:        General: Normal range of motion.     Cervical back: Normal range of motion and neck supple.     Right lower leg: No edema.     Left lower leg: No edema.  Feet:     Right foot:     Skin integrity: Skin integrity normal.     Left foot:     Skin integrity: Skin integrity normal.  Lymphadenopathy:     Head:     Right side of head: No submental or submandibular adenopathy.     Left side of head: No submental or submandibular adenopathy.     Cervical: No cervical adenopathy.     Upper Body:     Right upper body: No supraclavicular or axillary adenopathy.     Left upper body: No supraclavicular or axillary adenopathy.     Lower Body: No right inguinal adenopathy. No left inguinal adenopathy.  Skin:    General: Skin is warm.     Capillary Refill: Capillary refill takes less than 2 seconds.     Findings: No rash.  Neurological:     General: No focal deficit present.     Mental Status: She is alert and oriented to person, place, and  time.     Cranial Nerves: Cranial nerves 2-12 are intact.     Sensory: Sensation is intact.     Motor: Motor function is intact.     Coordination: Coordination is intact.     Gait: Gait is intact.     Deep Tendon Reflexes: Reflexes are normal and symmetric.  Psychiatric:        Mood and Affect: Mood normal.        Speech: Speech normal.        Behavior: Behavior normal. Behavior is cooperative.      Assessment & Plan   CPE Mammogram due in June Encouraged COVID vaccination at [facility] or pharmacy of choice Check Vitamin D  level with history of hyperparathyroidism.  To start Calcium  and Vitamin D  supplementation as not clear she is getting with diet. DEXA when menopausal. Encouraged walking 45 minutes 4 times weekly or more   2.  Mediastinal mass:  was to have Vitamin D , PTH and Calcium  checked through thoracic surgery, but does not appear ordered, so will do so today.  Has plans for repeat CT of chest in March to June sometime.  History of hyperparathyroidism S/P excision of parathyroid  adenoma.    3.  Thyromegaly:  TSH, FT4, thyroid  US .    4.  Prediabetes:  A1C  5.  Hiatal Hernia:  GERD precautions and Famotidine  daily.  6.  Decreased visual acuity:  optometry referral.

## 2023-11-19 LAB — COMPREHENSIVE METABOLIC PANEL
ALT: 13 [IU]/L (ref 0–32)
AST: 14 [IU]/L (ref 0–40)
Albumin: 4.4 g/dL (ref 3.9–4.9)
Alkaline Phosphatase: 71 [IU]/L (ref 44–121)
BUN/Creatinine Ratio: 12 (ref 9–23)
BUN: 9 mg/dL (ref 6–24)
Bilirubin Total: 0.8 mg/dL (ref 0.0–1.2)
CO2: 28 mmol/L (ref 20–29)
Calcium: 9.8 mg/dL (ref 8.7–10.2)
Chloride: 103 mmol/L (ref 96–106)
Creatinine, Ser: 0.77 mg/dL (ref 0.57–1.00)
Globulin, Total: 2.6 g/dL (ref 1.5–4.5)
Glucose: 85 mg/dL (ref 70–99)
Potassium: 3.8 mmol/L (ref 3.5–5.2)
Sodium: 141 mmol/L (ref 134–144)
Total Protein: 7 g/dL (ref 6.0–8.5)
eGFR: 96 mL/min/{1.73_m2} (ref >59)

## 2023-11-19 LAB — LIPID PANEL W/O CHOL/HDL RATIO
Cholesterol, Total: 163 mg/dL (ref 100–199)
HDL: 52 mg/dL (ref >39)
LDL Chol Calc (NIH): 102 mg/dL — ABNORMAL HIGH (ref 0–99)
Triglycerides: 39 mg/dL (ref 0–149)
VLDL Cholesterol Cal: 9 mg/dL (ref 5–40)

## 2023-11-19 LAB — CBC WITH DIFFERENTIAL/PLATELET
Basophils Absolute: 0 10*3/uL (ref 0.0–0.2)
Basos: 1 %
EOS (ABSOLUTE): 0.1 10*3/uL (ref 0.0–0.4)
Eos: 1 %
Hematocrit: 39.8 % (ref 34.0–46.6)
Hemoglobin: 13.4 g/dL (ref 11.1–15.9)
Immature Grans (Abs): 0 10*3/uL (ref 0.0–0.1)
Immature Granulocytes: 0 %
Lymphocytes Absolute: 3.2 10*3/uL — ABNORMAL HIGH (ref 0.7–3.1)
Lymphs: 49 %
MCH: 30.6 pg (ref 26.6–33.0)
MCHC: 33.7 g/dL (ref 31.5–35.7)
MCV: 91 fL (ref 79–97)
Monocytes Absolute: 0.4 10*3/uL (ref 0.1–0.9)
Monocytes: 6 %
Neutrophils Absolute: 2.7 10*3/uL (ref 1.4–7.0)
Neutrophils: 43 %
Platelets: 260 10*3/uL (ref 150–450)
RBC: 4.38 x10E6/uL (ref 3.77–5.28)
RDW: 11.9 % (ref 11.7–15.4)
WBC: 6.4 10*3/uL (ref 3.4–10.8)

## 2023-11-19 LAB — PTH, INTACT AND CALCIUM: PTH: 18 pg/mL (ref 15–65)

## 2023-11-19 LAB — HGB A1C W/O EAG: Hgb A1c MFr Bld: 5.8 % — ABNORMAL HIGH (ref 4.8–5.6)

## 2023-11-19 LAB — VITAMIN D 25 HYDROXY (VIT D DEFICIENCY, FRACTURES): Vit D, 25-Hydroxy: 20.8 ng/mL — ABNORMAL LOW (ref 30.0–100.0)

## 2023-11-20 LAB — SPECIMEN STATUS REPORT

## 2023-11-20 LAB — TSH: TSH: 0.902 u[IU]/mL (ref 0.450–4.500)

## 2023-12-03 ENCOUNTER — Other Ambulatory Visit: Payer: Self-pay | Admitting: Thoracic Surgery (Cardiothoracic Vascular Surgery)

## 2023-12-03 DIAGNOSIS — J9859 Other diseases of mediastinum, not elsewhere classified: Secondary | ICD-10-CM

## 2023-12-23 ENCOUNTER — Other Ambulatory Visit: Payer: Self-pay | Admitting: Psychology

## 2023-12-29 ENCOUNTER — Encounter: Payer: Self-pay | Admitting: Internal Medicine

## 2023-12-29 ENCOUNTER — Ambulatory Visit: Payer: Self-pay | Admitting: Internal Medicine

## 2023-12-29 VITALS — BP 118/80 | HR 64 | Resp 16 | Ht 69.0 in | Wt 209.8 lb

## 2023-12-29 DIAGNOSIS — J302 Other seasonal allergic rhinitis: Secondary | ICD-10-CM

## 2023-12-29 DIAGNOSIS — K219 Gastro-esophageal reflux disease without esophagitis: Secondary | ICD-10-CM

## 2023-12-29 DIAGNOSIS — J9859 Other diseases of mediastinum, not elsewhere classified: Secondary | ICD-10-CM

## 2023-12-29 NOTE — Progress Notes (Signed)
 Subjective:    Patient ID: Casey Mayo, female   DOB: 1976-01-13, 47 y.o.   MRN: [id number]   HPI   Allergies/GERD:  feels symptoms are well controlled with medications.  Swallowing is fine as well.    2.  Spitting up blood:  Occurred again this morning when in the shower.  No pain, no posterior pharyngeal drainage.  Just feels need to spit and notes a bit of blood in it.  Teeth and gums don't hurt.  She does remember brushing her teeth before she spit.  She did floss before she brushed her teeth as well.   Goes to dental clinic every 6 months for teeth cleaning.    3.  Mediastinal mass:  Follow up CT is scheduled for 01/19/24.  Cardiothoracic surgeons.   Current Meds  Medication Sig   Calcium  Citrate 250 MG TABS 2 tabs by mouth twice daily   Cholecalciferol (VITAMIN D3) 25 MCG (1000 UT) CAPS Take 1 capsule (1,000 Units total) by mouth daily.   escitalopram  (LEXAPRO ) 10 MG tablet 1 tab by mouth daily   famotidine  (PEPCID ) 20 MG tablet Take 1 tablet (20 mg total) by mouth 2 (two) times daily. (Patient taking differently: Take 40 mg by mouth daily.)   fluticasone  (FLONASE ) 50 MCG/ACT nasal spray Place 1 spray into both nostrils 2 (two) times daily.   levocetirizine (XYZAL  ALLERGY 24HR) 5 MG tablet Take 1 tablet (5 mg total) by mouth every evening.   levonorgestrel (MIRENA) 20 MCG/24HR IUD 1 each by Intrauterine route once. Placed 06/2020 by PHD:  5 year   montelukast  (SINGULAIR ) 10 MG tablet Take 1 tablet (10 mg total) by mouth at bedtime.   No Known Allergies   Review of Systems    Objective:   BP 118/80 (BP Location: Right Arm, Patient Position: Sitting, Cuff Size: Normal)   Pulse 64   Resp 16   Ht 5' 9 (1.753 m)   Wt 209 lb 12 oz (95.1 kg)   LMP 12/25/2023   SpO2 97%   BMI 30.97 kg/m   Physical Exam HENT:     Head: Normocephalic and atraumatic.     Right Ear: Tympanic membrane, ear canal and external ear normal.     Left Ear: Tympanic membrane, ear canal and  external ear normal.     Nose: Nose normal.     Mouth/Throat:     Mouth: Mucous membranes are moist.     Pharynx: Oropharynx is clear.     Comments: No gingival erythema or swelling.  No obvious source of bleeding and no current bleeding. Throat without erythema, swelling or exudate. Eyes:     Extraocular Movements: Extraocular movements intact.     Conjunctiva/sclera: Conjunctivae normal.     Pupils: Pupils are equal, round, and reactive to light.  Neck:     Thyroid : No thyroid  mass.  Cardiovascular:     Rate and Rhythm: Normal rate and regular rhythm.     Heart sounds: No murmur heard.    No friction rub.  Pulmonary:     Effort: Pulmonary effort is normal.     Breath sounds: Normal breath sounds.  Abdominal:     General: Bowel sounds are normal.     Palpations: Abdomen is soft.  Musculoskeletal:     Cervical back: Normal range of motion and neck supple.  Lymphadenopathy:     Cervical: No cervical adenopathy.  Neurological:     Mental Status: She is alert.  Assessment & Plan   Allergies:  controlled  2.  Spitting up blood:  occurred after flossing.  Not clear if that could be what caused blood in mouth.    3.  Mediastinal mass:  CT later this month.  Followed by Cardiothoracic Surgery

## 2024-01-19 ENCOUNTER — Ambulatory Visit
Admission: RE | Admit: 2024-01-19 | Discharge: 2024-01-19 | Disposition: A | Discharge status: Home / Self Care | Payer: Self-pay | Source: Ambulatory Visit | Attending: Thoracic Surgery (Cardiothoracic Vascular Surgery) | Admitting: Thoracic Surgery (Cardiothoracic Vascular Surgery)

## 2024-01-19 DIAGNOSIS — J9859 Other diseases of mediastinum, not elsewhere classified: Secondary | ICD-10-CM

## 2024-01-19 MED ORDER — IOPAMIDOL (ISOVUE-300) INJECTION 61%
500.0000 mL | Freq: Once | INTRAVENOUS | Status: AC | PRN
  Administered 2024-01-19: 70 mL via INTRAVENOUS

## 2024-01-26 ENCOUNTER — Other Ambulatory Visit: Payer: Self-pay

## 2024-01-26 ENCOUNTER — Ambulatory Visit: Payer: Self-pay | Admitting: Thoracic Surgery (Cardiothoracic Vascular Surgery)

## 2024-01-26 VITALS — BP 126/70 | HR 96 | Resp 20 | Ht 69.0 in | Wt 207.0 lb

## 2024-01-26 DIAGNOSIS — J9859 Other diseases of mediastinum, not elsewhere classified: Secondary | ICD-10-CM

## 2024-01-26 NOTE — Progress Notes (Signed)
 [address]       [place] [address]             [phone]      HPI: Casey Mayo returns for follow-up of an anterior mediastinal soft tissue density noted on CT.  Casey Mayo is a 48 year old woman with a history of hyperparathyroidism, parathyroidectomy, reflux, esophageal diverticulum, and depression.  She had a right inferior parathyroidectomy by Dr. Magnus Ivan in 2022.  June 2024 she had some difficulty swallowing.  Barium esophagram showed a small diverticulum in her proximal esophagus.  She complained of "spitting up" blood which led to a CT of the chest.  There was a 1.9 x 0.9 cm soft tissue density adjacent to the innominate vein and her anterior mediastinum.  I saw her regarding that.  We did parathyroid hormone levels which were normal and she also had a normal calcium level.  TSH is also normal.  She now returns for follow-up of that.  She continues to "spit up" blood.  Occurs randomly in very small amounts.  Denies any association with swallowing or coughing.  Past Medical History:  Diagnosis Date   Chronic hoarseness 2024   Multifactorial:  GERD, allergies, laryngeal mass, esophageal diverticulum.   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     escitalopram (LEXAPRO) 10 MG tablet 1 tab by mouth daily 30 tablet 4   famotidine (PEPCID) 20 MG tablet Take 1 tablet (20 mg total) by mouth 2 (two) times daily. (Patient taking differently: Take 40 mg by mouth daily.) 30 tablet  3   fluticasone (FLONASE) 50 MCG/ACT nasal spray Place 1 spray into both nostrils 2 (two) times daily. 16 g 12   levocetirizine (XYZAL ALLERGY 24HR) 5 MG tablet Take 1 tablet (5 mg total) by mouth every evening. 30 tablet 11   levonorgestrel (MIRENA) 20 MCG/24HR IUD 1 each by Intrauterine route once. Placed 06/2020 by PHD:  5 year     montelukast (SINGULAIR) 10 MG tablet Take 1 tablet (10 mg total) by mouth at bedtime. 30 tablet 11   No current facility-administered medications for this visit.    Physical Exam BP 126/70   Pulse 96   Resp 20   Ht 5\' 9"  (1.753 m)   Wt 207 lb (93.9 kg)   LMP 12/25/2023   SpO2 97% Comment: RA  BMI 30.57 kg/m  Well-appearing 48 year old woman in no acute distress Alert and oriented x 3 with no focal deficits Lungs clear with good breath sounds bilaterally Cardiac regular rate and rhythm No cervical or supraclavicular adenopathy  Diagnostic Tests: CT scan done 01/19/2024 has not yet been officially read.  I reviewed the film and compared to her previous CT.  The soft tissue density in the anterior mediastinum appears smaller.  Short axis 7 mm versus 11 mm previously.  Impression: Casey Mayo is a 48 year old woman with a history of hyperparathyroidism, parathyroidectomy, reflux, esophageal diverticulum, and depression.  Found to  have an anterior mediastinal soft tissue density on his CT 6 months ago.  Anterior mediastinal soft tissue density-appears smaller when compared side-by-side.  Unlikely to be neoplastic.  Likely a reactive node.  History of hyperparathyroidism-right inferior parathyroidectomy by Dr. Magnus Ivan in the past.  Normal parathyroid hormone level.  Esophageal diverticulum-no significant symptoms currently.  Unclear if this is related to her "spitting up blood."  Plan: Follow-up on official radiology report Will plan to see her back in 6 months with a repeat CT to follow-up on that area.  Will plan to do with both IV and oral  contrast.  I spent over 20 minutes in review of records, images, and in consultation with Casey Mayo today. Loreli Slot, MD [facility] [phone]

## 2024-03-22 ENCOUNTER — Other Ambulatory Visit: Payer: Self-pay

## 2024-03-22 DIAGNOSIS — R7989 Other specified abnormal findings of blood chemistry: Secondary | ICD-10-CM

## 2024-03-23 LAB — VITAMIN D 25 HYDROXY (VIT D DEFICIENCY, FRACTURES): Vit D, 25-Hydroxy: 33.4 ng/mL (ref 30.0–100.0)

## 2024-03-28 ENCOUNTER — Ambulatory Visit: Payer: Self-pay | Admitting: Internal Medicine

## 2024-05-17 ENCOUNTER — Ambulatory Visit: Payer: Self-pay | Admitting: Internal Medicine

## 2024-06-01 ENCOUNTER — Telehealth: Payer: Self-pay | Admitting: Internal Medicine

## 2024-06-01 NOTE — Telephone Encounter (Signed)
 Patient needs an appointment for patient is having pain on her right shoulder .   Patient denies injuring shoulder, patient states pain started two weeks ago, patient states it started with a little bit of pain but states three days ago pain has increase each day.    We will call patient when we have a cancellation.

## 2024-06-03 NOTE — Telephone Encounter (Signed)
 Patient has been schedule.

## 2024-06-06 ENCOUNTER — Encounter: Payer: Self-pay | Admitting: Internal Medicine

## 2024-06-06 ENCOUNTER — Ambulatory Visit: Payer: Self-pay | Admitting: Internal Medicine

## 2024-06-06 VITALS — BP 120/76 | HR 72 | Resp 18 | Ht 69.0 in | Wt 209.0 lb

## 2024-06-06 DIAGNOSIS — M25511 Pain in right shoulder: Secondary | ICD-10-CM

## 2024-06-06 MED ORDER — CYCLOBENZAPRINE HCL 10 MG PO TABS
ORAL_TABLET | ORAL | 1 refills | Status: AC
Start: 2024-06-06 — End: ?

## 2024-06-06 MED ORDER — IBUPROFEN 200 MG PO TABS: ORAL_TABLET | ORAL | Status: AC

## 2024-06-06 NOTE — Progress Notes (Signed)
    Subjective:    Patient ID: Casey Mayo, female   DOB: [birth year], 48 y.o.   MRN: [id number]   HPI   Had IUD removed in June at [facility] as she just did not like the IUD.  Had a period in July and was having periods regularly before IUD removed.  She and husband are using condoms  2.   Right shoulder and neck pain:  has had for many years.  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  She states she had complete resolution of the pain, but when visiting [place] in July, the pain started again.  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  [place] long connecting flights--not sure if worsened in flight.  She is definitely worse since returning, however.   Pain is worse when she sleeps on her right side.  If shoulder is externally rotated and abducted hurts more.   No numbness or tingling down her arm.    Current Meds  Medication Sig   Cholecalciferol (VITAMIN D3) 25 MCG (1000 UT) CAPS Take 1 capsule (1,000 Units total) by mouth daily.   escitalopram  (LEXAPRO ) 10 MG tablet 1 tab by mouth daily   famotidine  (PEPCID ) 20 MG tablet Take 1 tablet (20 mg total) by mouth 2 (two) times daily.   fluticasone  (FLONASE ) 50 MCG/ACT nasal spray Place 1 spray into both nostrils 2 (two) times daily.   levocetirizine (XYZAL  ALLERGY 24HR) 5 MG tablet Take 1 tablet (5 mg total) by mouth every evening.   montelukast  (SINGULAIR ) 10 MG tablet Take 1 tablet (10 mg total) by mouth at bedtime.   No Known Allergies   Review of Systems    Objective:   BP 120/76 (BP Location: Left Arm, Patient Position: Sitting)   Pulse 72   Resp 18   Ht 5' 9 (1.753 m)   Wt 209 lb (94.8 kg)   BMI 30.86 kg/m   Physical Exam Musculoskeletal:     Right shoulder: No swelling, deformity or effusion. Normal range of  motion.       Arms:     Comments: Tender and tight over circled area of right upper back and shoulder. NT over acromial bursa, clavicle, AC and CC joints, insertion of deltoid.  . Increased pain actually with internal rotation and abduction, especially at 90 degrees.  Unable to tolerate downward force on abducted, extended, internally rotated arm at shoulder.         Assessment & Plan   Likely right rotator cuff/supraspinatus tendinitis with trap spasm as well.  Cyclobenzaprine  for the latter.  Ibuprofen  600 mg twice daily with food for 2 weeks, then only as needed. Referral back to Ortho, [facility] Dragon Persons, [facility] for possible injection as worked well for her previously. To perform regular exercises she had previously Gave her exercises and stretches also.

## 2024-06-06 NOTE — Patient Instructions (Signed)
 Perform PT exercises twice daily (previously given)  Perform exercises we discussed at bedtime after applying heating pad to area for 20 minutes.

## 2024-06-22 ENCOUNTER — Ambulatory Visit (INDEPENDENT_AMBULATORY_CARE_PROVIDER_SITE_OTHER): Payer: Self-pay | Admitting: Orthopaedic Surgery

## 2024-06-22 ENCOUNTER — Other Ambulatory Visit: Payer: Self-pay

## 2024-06-22 ENCOUNTER — Other Ambulatory Visit (INDEPENDENT_AMBULATORY_CARE_PROVIDER_SITE_OTHER): Payer: Self-pay

## 2024-06-22 DIAGNOSIS — G8929 Other chronic pain: Secondary | ICD-10-CM

## 2024-06-22 DIAGNOSIS — M25511 Pain in right shoulder: Secondary | ICD-10-CM

## 2024-06-22 DIAGNOSIS — M7541 Impingement syndrome of right shoulder: Secondary | ICD-10-CM

## 2024-06-22 MED ORDER — BUPIVACAINE HCL 0.5 % IJ SOLN
3.0000 mL | INTRAMUSCULAR | Status: AC | PRN
  Administered 2024-06-22: 3 mL via INTRA_ARTICULAR

## 2024-06-22 MED ORDER — METHYLPREDNISOLONE ACETATE 40 MG/ML IJ SUSP
40.0000 mg | INTRAMUSCULAR | Status: AC | PRN
  Administered 2024-06-22: 40 mg via INTRA_ARTICULAR

## 2024-06-22 MED ORDER — LIDOCAINE HCL 1 % IJ SOLN
3.0000 mL | INTRAMUSCULAR | Status: AC | PRN
  Administered 2024-06-22: 3 mL

## 2024-06-22 NOTE — Progress Notes (Signed)
 Procedure Note  Patient: Casey Mayo             Date of Birth: 1976-10-09           MRN: [id number]             Visit Date: 06/22/2024  Procedures: Visit Diagnoses:  1. Chronic right shoulder pain   2. Subacromial impingement of right shoulder     Large Joint Inj: R subacromial bursa on 06/22/2024 8:38 PM Indications: pain Details: 22 G needle, ultrasound-guided  Arthrogram: No  Medications: 3 mL lidocaine  1 %; 3 mL bupivacaine  0.5 %; 40 mg methylPREDNISolone  acetate 40 MG/ML Outcome: tolerated well, no immediate complications Consent was given by the patient. Patient was prepped and draped in the usual sterile fashion.         Casey Mayo - 48 y.o. female MRN [id number]  Date of birth: 05/03/1976  Office Visit Note: Visit Date: 06/22/2024 PCP: Adella Norris, MD Referred by: Adella Norris, MD  Subjective: Chief Complaint  Patient presents with   Right Shoulder - Pain   HPI: Casey Mayo is a pleasant 48 y.o. female who presents today for right shoulder pain.  Recently saw her PCP who referred her here for possible repeat injection.  Previously seen in our office by PA Ronal Dragon in December of 2023 diagnosed with subacromial bursitis.  Patient was treated with cortisone injection using posterior subacromial approach.  Patient says pain completely went away for over a year with this treatment.  Today she returns with history similar to that of 2023.  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  Braiding hair causes right lateral shoulder pain that occasionally radiates down her arm but stops above her elbow.  She has been unable to work as much due to severity of right shoulder pain.  She also has significant pain when attempting to sleep and states she cannot lie on her right side.  Denies any acute injury since her last visit.  Pertinent ROS were reviewed with the patient and found to be negative unless otherwise specified above in HPI.    Assessment & Plan: Visit Diagnoses:  1. Chronic right shoulder pain     Plan: Exam and history consistent with subacromial impingement of right shoulder.  Patient responded well to cortisone injection in 2023.  Requesting repeat injection today.  Completed a right subacromial bursa injection today under ultrasound guidance.  Patient tolerated procedure well with no postprocedure complications.  Patient endorsed immediate relief of pain.  Will follow-up in 6 weeks if pain still occurring.  If pain persist may consider advanced imaging at that time such as MRI imaging.  Patient understand treatment plan has no further questions or concerns at this time.  Follow-up: No follow-ups on file.   Meds & Orders: No orders of the defined types were placed in this encounter.   Orders Placed This Encounter  Procedures   XR Shoulder Right   US  Guided Needle Placement - No Linked Charges     Procedures: US -Guided Subacromial Injection, Right Shoulder  After discussion on risks/benefits/indications, informed verbal consent was obtained. A timeout was then performed. Patient was seated on table in exam room. The patient's shoulder was prepped with betadine and alcohol swabs and utilizing lateral approach with ultrasound guidance, the patient's subacromial space was injected with 4:4:2 mixture of lidocaine :marcaine :depomedrol via an in-plane approach. Patient tolerated the procedure well without immediate complications.       Clinical History: No specialty  comments available.  She reports that she has never smoked. She has never been exposed to tobacco smoke. She has never used smokeless tobacco.  Recent Labs    11/18/23 1800  HGBA1C 5.8*    Objective:   Vital Signs: LMP 05/02/2024 Comment: IUD removed at [facility] in June.  Physical Exam  Gen: Well-appearing, in no acute distress; non-toxic CV: Well-perfused. Warm.  Resp: Breathing unlabored on room air; no wheezing. Psych: Fluid speech in  conversation; appropriate affect; normal thought process  Ortho Exam - Patient has no edema, ecchymoses, or increased erythema on inspection.  Mild TTP over AC joint.  No other TTP over bony landmarks of shoulder.  Patient has full active/passive pain-free range of motion.  Strength 5/5 in bilateral rotator cuffs.  Neurovascularly intact distally.  Reproduction of pain with Hawkins testing.  Negative O'Brien.  Negative belly press test.  Mild pain with empty can testing.  Exam consistent with subacromial impingement.  Imaging: No results found.  Past Medical/Family/Surgical/Social History: Medications & Allergies reviewed per EMR, new medications updated. 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   Esophageal diverticulum 08/31/2023   Chronic hoarseness 06/24/2023   Panic disorder 04/22/2023   Palpitations 04/21/2023   Stress 04/21/2023   Seasonal allergies 03/16/2023   Prediabetes 02/09/2023   Chronic bilateral thoracic back pain 02/09/2023   Gastroesophageal reflux disease 02/09/2023   Right knee pain 02/09/2023   Hiatal hernia 2024   Pain in right shoulder 10/13/2022   Class 1 obesity without serious comorbidity with body mass index (BMI) of 32.0 to 32.9 in adult 11/04/2020   Hyperparathyroidism (HCC)    Chronic pain of left ankle 10/03/2020   Chronic bilateral low back pain with left-sided sciatica 10/03/2020   Hypercalcemia 01/03/2019   Thyromegaly 09/29/2018   Neck pain 09/29/2018   Decreased visual acuity 09/29/2018   Foot cramps 09/29/2018   Gestational hypertension 04/21/2016   Pregnancy induced hypertension 10/28/2015   Past Medical History:  Diagnosis Date   Chronic hoarseness 2024   Multifactorial:  GERD, allergies, laryngeal mass, esophageal diverticulum.   Depression    Environmental and seasonal allergies    Esophageal diverticulum 2024   esophagram   GERD (gastroesophageal reflux disease)     Hiatal hernia 2024   on EGD   Hyperparathyroidism (HCC) 04/2019   Right inferior parathyroid  adenoma   Laryngeal mass 2024   ENT-nasopharyngeoscope--benign   Family History  Problem Relation Age of Onset   Heart disease Mother        Possibly CHF   Asthma Daughter    Asthma Son    Past Surgical History:  Procedure Laterality Date   MASS EXCISION Right 04/04/2021   Procedure: EXCISION RIGHT PARATHYROID  ADENOMA;  Surgeon: Vernetta Berg, MD;  Location: [facility];  Service: General;  Laterality: Right;   Social History   Occupational History   Occupation: Hair dresser   Occupation: Social worker  Tobacco Use   Smoking status: Never    Passive exposure: Never   Smokeless tobacco: Never  Vaping Use   Vaping status: Never Used  Substance and Sexual Activity   Alcohol use: No   Drug use: No   Sexual activity: Yes    Birth control/protection: Condom    Comment: IUD removed in June 2025

## 2024-06-30 ENCOUNTER — Telehealth: Payer: Self-pay

## 2024-07-13 ENCOUNTER — Other Ambulatory Visit: Payer: Self-pay | Admitting: Thoracic Surgery (Cardiothoracic Vascular Surgery)

## 2024-07-13 DIAGNOSIS — J9859 Other diseases of mediastinum, not elsewhere classified: Secondary | ICD-10-CM

## 2024-07-13 DIAGNOSIS — Q396 Congenital diverticulum of esophagus: Secondary | ICD-10-CM

## 2024-08-02 ENCOUNTER — Ambulatory Visit (HOSPITAL_COMMUNITY)
Admission: RE | Admit: 2024-08-02 | Discharge: 2024-08-02 | Payer: Self-pay | Attending: Thoracic Surgery (Cardiothoracic Vascular Surgery) | Admitting: Thoracic Surgery (Cardiothoracic Vascular Surgery)

## 2024-08-02 DIAGNOSIS — Q396 Congenital diverticulum of esophagus: Secondary | ICD-10-CM | POA: Insufficient documentation

## 2024-08-02 DIAGNOSIS — J9859 Other diseases of mediastinum, not elsewhere classified: Secondary | ICD-10-CM | POA: Insufficient documentation

## 2024-08-02 MED ORDER — IOHEXOL 350 MG/ML SOLN
50.0000 mL | Freq: Once | INTRAVENOUS | Status: AC | PRN
  Administered 2024-08-02: 50 mL via INTRAVENOUS

## 2024-08-10 ENCOUNTER — Encounter: Payer: Self-pay | Admitting: Internal Medicine

## 2024-08-10 NOTE — Telephone Encounter (Signed)
 Please schedule to see me for this first

## 2024-08-16 ENCOUNTER — Encounter: Payer: Self-pay | Admitting: Thoracic Surgery (Cardiothoracic Vascular Surgery)

## 2024-08-16 ENCOUNTER — Ambulatory Visit
Payer: Self-pay | Attending: Thoracic Surgery (Cardiothoracic Vascular Surgery) | Admitting: Thoracic Surgery (Cardiothoracic Vascular Surgery)

## 2024-08-16 VITALS — BP 145/81 | HR 63 | Resp 20 | Ht 69.0 in | Wt 213.8 lb

## 2024-08-16 DIAGNOSIS — J9859 Other diseases of mediastinum, not elsewhere classified: Secondary | ICD-10-CM

## 2024-08-16 NOTE — Progress Notes (Signed)
 [address], [place] [address]             [phone]   HPI: Casey Mayo returns for follow-up of a possible anterior mediastinal nodule  Casey Mayo is a 48 year old woman with a history of hyperparathyroidism, parathyroidectomy, reflux, esophageal diverticulum, and depression.  She had a right inferior parathyroidectomy by Dr. Vernetta in 2022.   June 2024 she had some difficulty swallowing. Barium esophagram showed a small diverticulum in her proximal esophagus. She complained of spitting up blood which led to a CT of the chest. There was a 1.9 x 0.9 cm soft tissue density adjacent to the innominate vein and her anterior mediastinum. I saw her regarding that. We did parathyroid  hormone levels which were normal and she also had a normal calcium  level. TSH is also normal.  She had a follow-up CT in April which showed it appeared smaller on that scan.  She now returns with a new follow-up scan.  Continues to occasionally have some issues with swallowing.  Has not been spitting up blood recently.  Past Medical History:  Diagnosis Date   Chronic hoarseness 2024   Multifactorial:  GERD, allergies, laryngeal mass, esophageal diverticulum.   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 Prominent right internal thoracic vein anterior to the proximal SVC, likely explaining the previously characterized right upper anterior mediastinal soft tissue density. 2. No suspicious mediastinal mass identified at this time.    Current Outpatient Medications  Medication Sig Dispense Refill   Calcium  Citrate 250 MG TABS 2 tabs by mouth twice daily     Cholecalciferol  (VITAMIN D3) 25 MCG (1000 UT) CAPS Take 1 capsule (1,000 Units total) by mouth daily.     cyclobenzaprine  (FLEXERIL ) 10 MG tablet 1/2 to 1 tab by mouth at bedtime as needed. 30 tablet 1   escitalopram  (LEXAPRO ) 10 MG tablet 1 tab by mouth daily 30 tablet 4   famotidine  (PEPCID ) 20 MG tablet Take 1 tablet (20 mg total) by mouth 2 (two) times daily. 30 tablet 3   fluticasone  (FLONASE ) 50 MCG/ACT nasal spray Place 1 spray into both nostrils 2 (two) times daily. 16 g 12   ibuprofen  (ADVIL ) 200 MG tablet 3 tabs by mouth twice daily with food for 2 weeks and then every 6 hours as needed.     levocetirizine (XYZAL  ALLERGY 24HR) 5 MG tablet Take 1 tablet (5 mg total) by mouth every evening. 30 tablet 11   montelukast  (SINGULAIR ) 10 MG tablet Take 1 tablet (10 mg total) by mouth at bedtime. 30 tablet 11   No current facility-administered medications for this visit.    Physical Exam BP (!) 145/81 (BP Location: Left Arm, Patient Position: Sitting, Cuff Size: Normal)   Pulse 63   Resp 20   Ht 5' 9 (1.753 m)   Wt 213 lb 12.8 oz (97 kg)   SpO2 99% Comment: RA  BMI 31.54 kg/m  48 year old woman in no acute distress Alert and oriented x 3 with no focal deficits Well-developed and well-nourished No cervical or supraclavicular adenopathy  Diagnostic Tests: CT CHEST WITH CONTRAST 08/02/2024 08:35:47 AM  TECHNIQUE: CT of the chest was performed with the administration of 50 mL iohexol  (OMNIPAQUE ) 350 MG/ML injection. Multiplanar reformatted images are provided for review. Automated exposure control, iterative reconstruction, and/or weight based adjustment of the mA/kV was utilized to reduce the radiation dose to as low as reasonably achievable.   COMPARISON: 01/19/2024   CLINICAL HISTORY: MEDIASTINAL MASS, ESOPHAGEAL DIVERTICULUM. Mediastinal mass.   FINDINGS:   MEDIASTINUM: Heart and pericardium are unremarkable. The central airways are clear. Surgical clips identified adjacent to the  right lobe of the thyroid  gland as before.   There is a prominent right internal thoracic vein seen just anterior to the proximal SVC and arising off the distal right subclavian vein. There is a small focus of gas within this structure on today's study. This most likely accounts for the previously characterized soft tissue density structure in the right upper anterior mediastinum, image 37/301. No suspicious anterior mediastinal mass identified.   LYMPH NODES: No mediastinal, hilar or axillary lymphadenopathy.   LUNGS AND PLEURA: No focal consolidation or pulmonary edema. No pleural effusion or pneumothorax.   SOFT TISSUES/BONES: No acute abnormality of the bones or soft tissues.   UPPER ABDOMEN: Limited images of the upper abdomen demonstrates no acute abnormality.   IMPRESSION: 1. Prominent right internal thoracic vein anterior to the proximal SVC, likely explaining the previously characterized right upper anterior mediastinal soft tissue density. 2. No suspicious mediastinal mass identified at this time.   Electronically signed by: Waddell Calk MD 08/02/2024 09:05 AM EDT RP Workstation: [id number] I personally reviewed the CT images.  Findings are consistent with the mass being a venous structure.  Impression: Casey Mayo is a 48 year old woman with a history of hyperparathyroidism, parathyroidectomy, reflux, esophageal diverticulum, depression, and a possible anterior mediastinal mass.    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  Plan: Follow-up with primary I will be happy to see Ms. Reap again at any time in the future if I can be of assistance with her care  Elspeth JAYSON Millers, MD [facility] [phone]

## 2024-09-13 ENCOUNTER — Ambulatory Visit: Payer: Self-pay

## 2024-10-06 ENCOUNTER — Ambulatory Visit: Payer: Self-pay | Admitting: Internal Medicine

## 2024-10-06 ENCOUNTER — Encounter: Payer: Self-pay | Admitting: Internal Medicine

## 2024-10-06 VITALS — BP 130/80 | HR 65 | Resp 14 | Ht 69.0 in | Wt 214.0 lb

## 2024-10-06 DIAGNOSIS — K219 Gastro-esophageal reflux disease without esophagitis: Secondary | ICD-10-CM

## 2024-10-06 DIAGNOSIS — J387 Other diseases of larynx: Secondary | ICD-10-CM

## 2024-10-06 DIAGNOSIS — R490 Dysphonia: Secondary | ICD-10-CM

## 2024-10-06 DIAGNOSIS — R042 Hemoptysis: Secondary | ICD-10-CM | POA: Insufficient documentation

## 2024-10-06 NOTE — Progress Notes (Deleted)
° °  Acute Office Visit  Subjective:     Patient ID: Casey Mayo, female    DOB: 1976/03/11, 48 y.o.   MRN: [id number]  Chief Complaint  Patient presents with   Shoulder Pain    F/U injection of shoulder; Seen for shoulder pain 4 months ago    Allergies[1]     Patient is in Review of Systems  Constitutional:  Negative for fever.       Sleep issues.        Objective:     Vitals:   10/06/24 1044  BP: 130/80  Pulse: 65  Resp: 14  Weight               214 pounds  Physical Exam  No results found for any visits on 10/06/24.      Assessment & Plan:   Problem List Items Addressed This Visit   None   No orders of the defined types were placed in this encounter.   No follow-ups on file.  Caron Kaiser, MD      [1] No Known Allergies

## 2024-10-06 NOTE — Progress Notes (Unsigned)
 Acute Office Visit  Subjective:  I. MD in training under Dr. CHRISTELLA, did initial H/P and vitals, then presented to Dr. CHRISTELLA who did her own eval., final A/P, orders, pt counsiling, and discharge instructions, written and verbal.   Patient ID: Casey Mayo, female    DOB: 1976-07-23, 48 y.o.   MRN: [id number]  Chief Complaint  Patient presents with   Shoulder Pain    F/U injection of shoulder; Seen for shoulder pain 4 months ago  -Also wants to discuss spitting up blood.   HPI  Hx from EPIC and the pt.  1- RIGHT SHOULDER PAIN F/U- She was seen by Dr. CHRISTELLA 4 months ago and sent for injection.  She had the injection and has essential complete resolution of sxs.  Occasionally she gets a discomfort there when she sleeps on it. Otherwise, she expresses no further concerns about the pain.   2- CHRONIC SPITTING UP of BRIGHT RED BLOOD-  This is in the process of being evaluated. Hx is not clear.  More than 1 year of spitting up bright red blood after rinsing her mouth. She prays 5 times a day and has to rinse her mouth before each prayer.  When she spits up, there is blood in the spit.  Occurs 2-3 times a day every day.  Occasionally there would be a week where there is no bleeding.  No cough, h/o TB, h/o TB exposure. Has been in the [place]  for [place] years.  +tavel to [place] 1.5 years ago.  - She also says that she has GERD and after heavy meals, stuff comes up to her throat and there is sometimes blood in it.   She also gets mid-sternal chest pressure.  This is allso a year or so per pt.   Has been taking famotidine  20 mg bid, one prescription, one OTC. Says the OTC version works better.  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   Esophageal diverticulum 08/31/2023   Chronic hoarseness 06/24/2023   Panic disorder 04/22/2023   Palpitations 04/21/2023   Stress 04/21/2023   Seasonal allergies 03/16/2023   Prediabetes 02/09/2023   Chronic bilateral thoracic back pain 02/09/2023   Gastroesophageal reflux disease 02/09/2023   Right knee pain 02/09/2023   Hiatal hernia 2024   Pain in right shoulder 10/13/2022   Class 1 obesity without serious comorbidity with body mass index (BMI) of 32.0 to 32.9 in adult 11/04/2020   Hyperparathyroidism    Chronic pain of left ankle 10/03/2020   Chronic bilateral low back pain with left-sided sciatica 10/03/2020   Hypercalcemia 01/03/2019   Thyromegaly 09/29/2018   Neck pain 09/29/2018   Decreased visual acuity 09/29/2018   Foot cramps 09/29/2018   Gestational hypertension 04/21/2016   Pregnancy induced hypertension 10/28/2015    Past Surgical History:  Procedure Laterality Date   MASS EXCISION Right 04/04/2021   Procedure: EXCISION RIGHT PARATHYROID  ADENOMA;  Surgeon: Vernetta Berg, MD;  Location: [facility];  Service: General;  Laterality: Right;    Fhx- No h/o stomach and ENT cancers per pt  Allergies[1]  Hover over dots to see allergy  Active Medications[2]  Hover over dots to  see meds   Review of Systems  Constitutional:  Positive for diaphoresis (Hot flashes? maybe). Negative for malaise/fatigue (broken sleep; Goes to bed at different hours; tired durign the day without coffee. Gets up to uriante 4 times a night; Drinks 4-6 16 oz bottles of water.) and weight loss.  Respiratory:  Negative for cough, hemoptysis and shortness of breath.        Covid vaccine due- pt Delclined covid vaccine  Gastrointestinal:  Positive for heartburn. Negative for blood in stool and melena.  Genitourinary:        Nocturia      Objective:    BP 130/80   Pulse 65   Resp 14    Ht 5' 9 (1.753 m)   Wt 214 lb (97.1 kg)   BMI 31.60 kg/m   Physical Exam Vitals and nursing note reviewed.  Constitutional:      General: She is not in acute distress.    Appearance: She is not ill-appearing, toxic-appearing or diaphoretic.     Comments: Dozed off a few times during interview; says she did not have coffee today and went to bed late last night   -Overweight  HENT:     Mouth/Throat:     Mouth: Mucous membranes are moist.     Pharynx: No oropharyngeal exudate or posterior oropharyngeal erythema.  Neck:     Comments: Tenderness in the upper anterior triangle of the neck on the right but no masses or nodules palpable  Neck appears normal  Abdominal:     Tenderness: There is no abdominal tenderness.  Musculoskeletal:     Cervical back: No rigidity.     Comments: Right shoulder with full ROM without pain. Strength intact in both arms upper and proximal muscles. Good grip.   Lymphadenopathy:     Cervical: No cervical adenopathy.  Skin:    Findings: No rash.  Neurological:     Mental Status: She is alert.     Comments: Intact sensation in the finger right arm  Psychiatric:        Mood and Affect: Mood normal.        Behavior: Behavior normal.        Judgment: Judgment normal.     Comments: Limited insight into illness       Assessment & Plan:  1-  Laryngeal Cyst - Placed order for ENT Referral  2-  GERD with chest pain and regurgitation - Ordered Prilosec 40 mg po every day - Pt told by Dr. CHRISTELLA to stop Famotidine  20 mg po BID; I could not DC it on EPIC; defer to Dr. CHRISTELLA.   3-Hoarseness of Voice  4-Spitting blood for a year- Maybe from over-cleaning of teeth and gums or esophageal diverticulum; History vague. Etiology not clear. -Asked to take picture of it and bring it to clinic  5- Acute right shoulder pain s/p injection- Almost pain-free  - Told to not sleep on it  6-Covid due- pt declined  7-Calcium  and Vit D adjustment - Taking her own OTC 2  multi-Vit Gummies (contains  about 500 mg Calcium  and 1000 international units Vit D. per day. -Pt advised to take 2 cups of yogurt and/or milk or cheese per day.   D/C calcium  and Vit D supplements ordered on EPIC - I could not change it on EPIC. Defer to Dr. Adella.   8- Interrupted sleep due to Nocturia; Not enough rest- Not affecting quality of life according to pt but seemed tired during exam- -  Asked to drink about 4-5 bottles of 16 oz water per day and not after 6 pm to see if that would help the Night time waking to urinate - Try to go to bed by 10 pm everyday as she has to get up at 530am for her prayer.   9- Nocturia- Not really bothering the pt at this time. -Water restriction after 6 pm -Re-evaluate  Return in about 2 months (around 12/07/2024) for Follow up GERD with prilosec.  Caron Kaiser, MD      [1] No Known Allergies [2]  Current Meds  Medication Sig   Cholecalciferol (VITAMIN D3) 25 MCG (1000 UT) CAPS Take 1 capsule (1,000 Units total) by mouth daily. (Patient taking differently: Take 1,000 Units by mouth daily. OTC)   cyclobenzaprine  (FLEXERIL ) 10 MG tablet 1/2 to 1 tab by mouth at bedtime as needed.   famotidine  (PEPCID ) 20 MG tablet Take 1 tablet (20 mg total) by mouth 2 (two) times daily. (Patient taking differently: Take 20 mg by mouth 2 (two) times daily. Sometimes takes OTC med famotidine  20 mg which works better)   fluticasone  (FLONASE ) 50 MCG/ACT nasal spray Place 1 spray into both nostrils 2 (two) times daily. (Patient taking differently: Place 1 spray into both nostrils 2 (two) times daily. PRN)   ibuprofen  (ADVIL ) 200 MG tablet 3 tabs by mouth twice daily with food for 2 weeks and then every 6 hours as needed.   Multiple Vitamins-Minerals (MULTIVITAMIN WITH MINERALS) tablet Take 1 tablet by mouth daily.   omeprazole  (PRILOSEC) 40 MG capsule 1 cap by mouth with water only 1/2 hour before breakfast daily.

## 2024-11-08 ENCOUNTER — Institutional Professional Consult (permissible substitution) (INDEPENDENT_AMBULATORY_CARE_PROVIDER_SITE_OTHER): Payer: Self-pay | Admitting: Otolaryngology

## 2024-11-24 ENCOUNTER — Encounter: Payer: Self-pay | Admitting: Internal Medicine

## 2024-11-24 ENCOUNTER — Ambulatory Visit: Payer: Self-pay | Admitting: Internal Medicine

## 2024-11-24 VITALS — BP 130/80 | HR 68 | Resp 14 | Ht 69.5 in | Wt 216.0 lb

## 2024-11-24 DIAGNOSIS — R7303 Prediabetes: Secondary | ICD-10-CM

## 2024-11-24 DIAGNOSIS — E010 Iodine-deficiency related diffuse (endemic) goiter: Secondary | ICD-10-CM

## 2024-11-24 DIAGNOSIS — Z0000 Encounter for general adult medical examination without abnormal findings: Secondary | ICD-10-CM

## 2024-11-24 DIAGNOSIS — K219 Gastro-esophageal reflux disease without esophagitis: Secondary | ICD-10-CM

## 2024-11-24 DIAGNOSIS — Z1159 Encounter for screening for other viral diseases: Secondary | ICD-10-CM

## 2024-11-24 NOTE — Progress Notes (Signed)
 I was not able to withdraw blood because she is dehydrated . Erminio is going to schedule her for another time

## 2024-11-24 NOTE — Progress Notes (Signed)
 "   Subjective:    Patient ID: Casey Mayo, female   DOB: Mar 01, 1976, 49 y.o.   MRN: [id number]   HPI  CPE without pap  1.  Pap:  Last in June of 2025 and normal at [facility].    2.  Mammogram:  Last in 2024 and normal.  She is feeling out paperwork for [facility] with [facility].  No family history of breast cancer.    3.  Osteoprevention:  Taking Citrated calcium  and Vitamin D  3 regularly.  Walks 5 days weekly for 45 minutes.    4.  Guaiac Cards/FIT:  Never.   5.  Colonoscopy:  Never, though did have EGD in 07/2023 for her upper GI complaints.  No family history of colon cancer.   6.  Immunizations:  Has not had COVID vaccine.   Immunization History  Administered Date(s) Administered   Influenza, Seasonal, Injecte, Preservative Fre 08/11/2006, 08/01/2010, 07/15/2024   Influenza,inj,Quad PF,6+ Mos 10/30/2015   Influenza-Unspecified 07/17/2023   PFIZER(Purple Top)SARS-COV-2 Vaccination 01/14/2020, 02/04/2020, 10/29/2020   Td (Adult),5 Lf Tetanus Toxid, Preservative Free 08/25/2006   Tdap 01/28/2016, 04/23/2016     7.  Glucose/Cholesterol:    Current Meds  Medication Sig   Calcium  Citrate 250 MG TABS 2 tabs by mouth twice daily   Cholecalciferol (VITAMIN D3) 25 MCG (1000 UT) CAPS Take 1 capsule (1,000 Units total) by mouth daily.   cyclobenzaprine  (FLEXERIL ) 10 MG tablet 1/2 to 1 tab by mouth at bedtime as needed.   fluticasone  (FLONASE ) 50 MCG/ACT nasal spray Place 1 spray into both nostrils 2 (two) times daily. (Patient taking differently: Place 1 spray into both nostrils as needed.)   ibuprofen  (ADVIL ) 200 MG tablet 3 tabs by mouth twice daily with food for 2 weeks and then every 6 hours as needed.   levocetirizine (XYZAL  ALLERGY 24HR) 5 MG tablet Take 1 tablet (5 mg total) by mouth every evening. (Patient taking differently: Take 5 mg by mouth every evening. As needed)   montelukast  (SINGULAIR ) 10 MG tablet Take 1 tablet (10 mg total) by mouth at bedtime. (Patient  taking differently: Take 10 mg by mouth at bedtime. As needed)   omeprazole  (PRILOSEC) 40 MG capsule 1 cap by mouth with water only 1/2 hour before breakfast daily.   No Known Allergies  Past Medical History:  Diagnosis Date   Chronic hoarseness 2024   Multifactorial:  GERD, allergies, laryngeal mass, esophageal diverticulum.   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 Prominent right internal thoracic vein anterior to the proximal SVC, likely explaining the previously characterized right upper anterior mediastinal soft tissue density. 2. No suspicious mediastinal mass identified at this time.   Past Surgical History:  Procedure Laterality Date   MASS EXCISION Right 04/04/2021   Procedure: EXCISION RIGHT PARATHYROID  ADENOMA;  Surgeon: Vernetta Berg, MD;  Location: [facility];  Service: General;  Laterality: Right;   Family History  Problem Relation Age of Onset   Heart disease Mother        Possibly CHF   Asthma Daughter    Asthma Son    Social History   Socioeconomic History   Marital status:  Married    Spouse name: Ibra Rudd   Number of children: 4   Years of education: 5   Highest education level: Not on file  Occupational History   Occupation: Hair dresser   Occupation: hair stylist  Tobacco Use   Smoking status: Never    Passive exposure: Never   Smokeless tobacco: Never  Vaping Use   Vaping status: Never Used  Substance and Sexual Activity   Alcohol use: No   Drug use: No   Sexual activity: Yes    Birth control/protection: Condom    Comment: IUD removed in June 2025  Other Topics Concern   Not on file  Social History Narrative   Lives at home with  husband, 3 youngest children   36 yo daughter still lives in [place].   Social Drivers of Health   Tobacco Use: Low Risk (11/24/2024)   Patient History    Smoking Tobacco Use: Never    Smokeless Tobacco Use: Never    Passive Exposure: Never  Financial Resource Strain: Low Risk (11/18/2023)   Overall Financial Resource Strain (CARDIA)    Difficulty of Paying Living Expenses: Not hard at all  Food Insecurity: No Food Insecurity (11/24/2024)   Epic    Worried About Radiation Protection Practitioner of Food in the Last Year: Never true    Ran Out of Food in the Last Year: Never true  Transportation Needs: No Transportation Needs (11/18/2023)   PRAPARE - Administrator, Civil Service (Medical): No    Lack of Transportation (Non-Medical): No  Physical Activity: Not on file  Stress: Not on file  Social Connections: Unknown (03/11/2022)   Received from [facility]   Social Network    Social Network: Not on file  Intimate Partner Violence: Not At Risk (11/24/2024)   Epic    Fear of Current or Ex-Partner: No    Emotionally Abused: No    Physically Abused: No    Sexually Abused: No  Depression (PHQ2-9): Not on file  Alcohol Screen: Not on file  Housing: Unknown (11/24/2024)   Epic    Unable to Pay for Housing in the Last Year: No    Number of Times Moved in the Last Year: Not on file    Homeless in the Last Year: No  Utilities: Not At Risk (11/24/2024)   Epic    Threatened with loss of utilities: No  Health Literacy: Not on file       Review of Systems  HENT:  Positive for dental problem (Waiting to hear back from dental clinic.).   Eyes:  Negative for visual disturbance (glasses working for her).  Respiratory:  Negative for shortness of breath.   Cardiovascular:  Negative for chest pain, palpitations and leg swelling.  Gastrointestinal:  Negative for abdominal pain and blood in stool (No melena).       Swallowing normal now and hoarseness much improved.  Neurological:  Negative for  weakness and numbness.      Objective:   BP 130/80 (BP Location: Right Arm, Patient Position: Sitting, Cuff Size: Normal)   Pulse 68   Resp 14   Ht 5' 9.5 (1.765 m)   Wt 216 lb (98 kg)   LMP 10/28/2024 (Approximate)   BMI 31.44 kg/m   Physical Exam Constitutional:      Appearance: She is obese.  HENT:     Head: Normocephalic and atraumatic.     Right Ear: Tympanic membrane, ear canal and external ear normal.  Left Ear: Tympanic membrane, ear canal and external ear normal.     Nose: Nose normal.     Mouth/Throat:     Mouth: Mucous membranes are moist.     Pharynx: Oropharynx is clear.  Eyes:     Extraocular Movements: Extraocular movements intact.     Conjunctiva/sclera: Conjunctivae normal.     Pupils: Pupils are equal, round, and reactive to light.  Neck:     Thyroid : Thyromegaly (mild) present. No thyroid  mass.  Cardiovascular:     Rate and Rhythm: Normal rate and regular rhythm.     Heart sounds: S1 normal and S2 normal. No murmur heard.    No friction rub. No S3 or S4 sounds.     Comments: No carotid bruits.  Carotid, radial, femoral, DP and PT pulses normal and equal.   Pulmonary:     Effort: Pulmonary effort is normal.     Breath sounds: Normal breath sounds and air entry.  Chest:     Comments: Deferred to PHD Abdominal:     General: Bowel sounds are normal.     Palpations: Abdomen is soft. There is no hepatomegaly, splenomegaly or mass.     Tenderness: There is no abdominal tenderness.  Genitourinary:    Comments: Deferred as exam performed already at [facility] Musculoskeletal:        General: Normal range of motion.     Cervical back: Normal range of motion and neck supple.     Right lower leg: No edema.     Left lower leg: No edema.  Lymphadenopathy:     Head:     Right side of head: No submental or submandibular adenopathy.     Left side of head: No submental or submandibular adenopathy.     Cervical: No cervical adenopathy.     Upper Body:      Right upper body: No supraclavicular or axillary adenopathy.     Left upper body: No supraclavicular or axillary adenopathy.     Lower Body: No right inguinal adenopathy. No left inguinal adenopathy.  Skin:    General: Skin is warm.     Capillary Refill: Capillary refill takes less than 2 seconds.     Findings: No rash.  Neurological:     General: No focal deficit present.     Mental Status: She is alert and oriented to person, place, and time.     Cranial Nerves: Cranial nerves 2-12 are intact.     Sensory: Sensation is intact.     Motor: Motor function is intact.     Coordination: Coordination is intact.     Gait: Gait is intact.     Deep Tendon Reflexes: Reflexes are normal and symmetric.  Psychiatric:        Mood and Affect: Mood normal.        Behavior: Behavior normal.      Assessment & Plan   CPE without pap Gynecology exam with PHD She is applying for scholarship mammogram FIT to return in 2 weeks Encouraged COVID, she declines CMP, CBC, Hep C screen  2  Prediabetes:  FLP, A1C  3.  Thyromegaly:  TSH  4.  Hoaresness, esophageal diverticulum, allergies, GERD, Laryngeal mass:  Has  follow up I believe with ENT, but much better at this point.  GERD now controlled "

## 2024-11-25 ENCOUNTER — Other Ambulatory Visit: Payer: Self-pay

## 2024-12-06 ENCOUNTER — Institutional Professional Consult (permissible substitution) (INDEPENDENT_AMBULATORY_CARE_PROVIDER_SITE_OTHER): Payer: Self-pay | Admitting: Otolaryngology

## 2024-12-08 ENCOUNTER — Ambulatory Visit: Payer: Self-pay | Admitting: Internal Medicine

## 2024-12-20 ENCOUNTER — Other Ambulatory Visit: Payer: Self-pay

## 2025-11-24 ENCOUNTER — Other Ambulatory Visit: Payer: Self-pay | Admitting: Internal Medicine

## 2025-11-29 ENCOUNTER — Encounter: Payer: Self-pay | Admitting: Internal Medicine
